# Patient Record
Sex: Female | Born: 1975
Health system: Southern US, Community
[De-identification: ages and names within clinical notes are randomized; demographics above are authoritative.]

## PROBLEM LIST (undated history)

## (undated) DIAGNOSIS — H04129 Dry eye syndrome of unspecified lacrimal gland: Secondary | ICD-10-CM

## (undated) DIAGNOSIS — E063 Autoimmune thyroiditis: Secondary | ICD-10-CM

## (undated) DIAGNOSIS — S134XXA Sprain of ligaments of cervical spine, initial encounter: Secondary | ICD-10-CM

## (undated) DIAGNOSIS — N94819 Vulvodynia, unspecified: Secondary | ICD-10-CM

## (undated) DIAGNOSIS — E039 Hypothyroidism, unspecified: Secondary | ICD-10-CM

## (undated) DIAGNOSIS — G35 Multiple sclerosis: Secondary | ICD-10-CM

## (undated) HISTORY — PX: OTHER SURGICAL HISTORY: SHX169

## (undated) HISTORY — DX: Vulvodynia, unspecified: N94.819

## (undated) HISTORY — DX: Hypothyroidism, unspecified: E03.9

## (undated) HISTORY — DX: Multiple sclerosis: G35

## (undated) HISTORY — DX: Dry eye syndrome of unspecified lacrimal gland: H04.129

## (undated) HISTORY — DX: Autoimmune thyroiditis: E06.3

---

## 2009-12-06 HISTORY — PX: KNEE SURGERY: SHX244

## 2015-12-07 HISTORY — PX: OTHER SURGICAL HISTORY: SHX169

## 2018-11-16 ENCOUNTER — Other Ambulatory Visit (HOSPITAL_COMMUNITY)
Admission: RE | Admit: 2018-11-16 | Discharge: 2018-11-16 | Disposition: A | Discharge status: Home / Self Care | Payer: BLUE CROSS/BLUE SHIELD | Source: Ambulatory Visit | Attending: Obstetrics and Gynecology | Admitting: Obstetrics and Gynecology

## 2018-11-16 ENCOUNTER — Other Ambulatory Visit: Payer: Self-pay | Admitting: Obstetrics and Gynecology

## 2018-11-16 DIAGNOSIS — Z01419 Encounter for gynecological examination (general) (routine) without abnormal findings: Secondary | ICD-10-CM | POA: Diagnosis not present

## 2018-11-22 LAB — CYTOLOGY - PAP
Diagnosis: NEGATIVE
HPV: NOT DETECTED

## 2019-04-17 DIAGNOSIS — E559 Vitamin D deficiency, unspecified: Secondary | ICD-10-CM | POA: Diagnosis not present

## 2019-04-17 DIAGNOSIS — R5383 Other fatigue: Secondary | ICD-10-CM | POA: Diagnosis not present

## 2019-04-17 DIAGNOSIS — F419 Anxiety disorder, unspecified: Secondary | ICD-10-CM | POA: Diagnosis not present

## 2019-04-17 DIAGNOSIS — G35 Multiple sclerosis: Secondary | ICD-10-CM | POA: Diagnosis not present

## 2019-05-08 ENCOUNTER — Other Ambulatory Visit: Payer: Self-pay | Admitting: Obstetrics and Gynecology

## 2019-05-08 DIAGNOSIS — Z1231 Encounter for screening mammogram for malignant neoplasm of breast: Secondary | ICD-10-CM

## 2019-06-25 ENCOUNTER — Other Ambulatory Visit: Payer: Self-pay

## 2019-06-25 ENCOUNTER — Ambulatory Visit
Admission: RE | Admit: 2019-06-25 | Discharge: 2019-06-25 | Disposition: A | Discharge status: Home / Self Care | Payer: BC Managed Care – PPO | Source: Ambulatory Visit | Attending: Obstetrics and Gynecology | Admitting: Obstetrics and Gynecology

## 2019-06-25 DIAGNOSIS — Z1231 Encounter for screening mammogram for malignant neoplasm of breast: Secondary | ICD-10-CM | POA: Diagnosis not present

## 2019-07-17 DIAGNOSIS — D509 Iron deficiency anemia, unspecified: Secondary | ICD-10-CM | POA: Diagnosis not present

## 2019-07-17 DIAGNOSIS — G35 Multiple sclerosis: Secondary | ICD-10-CM | POA: Diagnosis not present

## 2019-07-17 DIAGNOSIS — M545 Low back pain: Secondary | ICD-10-CM | POA: Diagnosis not present

## 2019-07-17 DIAGNOSIS — R5383 Other fatigue: Secondary | ICD-10-CM | POA: Diagnosis not present

## 2019-08-10 DIAGNOSIS — N762 Acute vulvitis: Secondary | ICD-10-CM | POA: Diagnosis not present

## 2019-08-10 DIAGNOSIS — N9089 Other specified noninflammatory disorders of vulva and perineum: Secondary | ICD-10-CM | POA: Diagnosis not present

## 2019-08-10 DIAGNOSIS — N898 Other specified noninflammatory disorders of vagina: Secondary | ICD-10-CM | POA: Diagnosis not present

## 2019-09-07 DIAGNOSIS — G35 Multiple sclerosis: Secondary | ICD-10-CM | POA: Diagnosis not present

## 2019-10-16 DIAGNOSIS — F419 Anxiety disorder, unspecified: Secondary | ICD-10-CM | POA: Diagnosis not present

## 2019-10-16 DIAGNOSIS — R5383 Other fatigue: Secondary | ICD-10-CM | POA: Diagnosis not present

## 2019-10-16 DIAGNOSIS — G35 Multiple sclerosis: Secondary | ICD-10-CM | POA: Diagnosis not present

## 2019-10-16 DIAGNOSIS — D509 Iron deficiency anemia, unspecified: Secondary | ICD-10-CM | POA: Diagnosis not present

## 2019-10-29 DIAGNOSIS — J029 Acute pharyngitis, unspecified: Secondary | ICD-10-CM | POA: Diagnosis not present

## 2019-11-20 DIAGNOSIS — H04123 Dry eye syndrome of bilateral lacrimal glands: Secondary | ICD-10-CM | POA: Diagnosis not present

## 2019-11-20 DIAGNOSIS — G35 Multiple sclerosis: Secondary | ICD-10-CM | POA: Diagnosis not present

## 2019-11-20 DIAGNOSIS — Z973 Presence of spectacles and contact lenses: Secondary | ICD-10-CM | POA: Diagnosis not present

## 2019-11-20 DIAGNOSIS — D3132 Benign neoplasm of left choroid: Secondary | ICD-10-CM | POA: Diagnosis not present

## 2019-11-23 DIAGNOSIS — Z01419 Encounter for gynecological examination (general) (routine) without abnormal findings: Secondary | ICD-10-CM | POA: Diagnosis not present

## 2019-12-10 DIAGNOSIS — R9082 White matter disease, unspecified: Secondary | ICD-10-CM | POA: Diagnosis not present

## 2019-12-10 DIAGNOSIS — G35 Multiple sclerosis: Secondary | ICD-10-CM | POA: Diagnosis not present

## 2019-12-12 DIAGNOSIS — G35 Multiple sclerosis: Secondary | ICD-10-CM | POA: Diagnosis not present

## 2020-02-07 DIAGNOSIS — E038 Other specified hypothyroidism: Secondary | ICD-10-CM | POA: Diagnosis not present

## 2020-02-07 DIAGNOSIS — E559 Vitamin D deficiency, unspecified: Secondary | ICD-10-CM | POA: Diagnosis not present

## 2020-02-07 DIAGNOSIS — E042 Nontoxic multinodular goiter: Secondary | ICD-10-CM | POA: Diagnosis not present

## 2020-02-07 DIAGNOSIS — G35 Multiple sclerosis: Secondary | ICD-10-CM | POA: Diagnosis not present

## 2020-02-07 DIAGNOSIS — Z1331 Encounter for screening for depression: Secondary | ICD-10-CM | POA: Diagnosis not present

## 2020-02-11 ENCOUNTER — Other Ambulatory Visit: Payer: Self-pay | Admitting: Internal Medicine

## 2020-02-11 DIAGNOSIS — E042 Nontoxic multinodular goiter: Secondary | ICD-10-CM

## 2020-02-15 ENCOUNTER — Other Ambulatory Visit: Payer: Self-pay

## 2020-02-15 ENCOUNTER — Ambulatory Visit
Admission: RE | Admit: 2020-02-15 | Discharge: 2020-02-15 | Disposition: A | Discharge status: Home / Self Care | Payer: BC Managed Care – PPO | Source: Ambulatory Visit | Attending: Internal Medicine | Admitting: Internal Medicine

## 2020-02-15 DIAGNOSIS — E042 Nontoxic multinodular goiter: Secondary | ICD-10-CM | POA: Diagnosis not present

## 2020-03-18 DIAGNOSIS — G35 Multiple sclerosis: Secondary | ICD-10-CM | POA: Diagnosis not present

## 2020-04-30 DIAGNOSIS — Z79899 Other long term (current) drug therapy: Secondary | ICD-10-CM | POA: Diagnosis not present

## 2020-04-30 DIAGNOSIS — G35 Multiple sclerosis: Secondary | ICD-10-CM | POA: Diagnosis not present

## 2020-04-30 DIAGNOSIS — G8911 Acute pain due to trauma: Secondary | ICD-10-CM | POA: Diagnosis not present

## 2020-04-30 DIAGNOSIS — M542 Cervicalgia: Secondary | ICD-10-CM | POA: Diagnosis not present

## 2020-04-30 DIAGNOSIS — M436 Torticollis: Secondary | ICD-10-CM | POA: Diagnosis not present

## 2020-06-04 DIAGNOSIS — E559 Vitamin D deficiency, unspecified: Secondary | ICD-10-CM | POA: Diagnosis not present

## 2020-06-04 DIAGNOSIS — E038 Other specified hypothyroidism: Secondary | ICD-10-CM | POA: Diagnosis not present

## 2020-06-04 DIAGNOSIS — Z0000 Encounter for general adult medical examination without abnormal findings: Secondary | ICD-10-CM | POA: Diagnosis not present

## 2020-06-11 DIAGNOSIS — E042 Nontoxic multinodular goiter: Secondary | ICD-10-CM | POA: Diagnosis not present

## 2020-06-11 DIAGNOSIS — Z0000 Encounter for general adult medical examination without abnormal findings: Secondary | ICD-10-CM | POA: Diagnosis not present

## 2020-06-11 DIAGNOSIS — Z1331 Encounter for screening for depression: Secondary | ICD-10-CM | POA: Diagnosis not present

## 2020-06-11 DIAGNOSIS — E559 Vitamin D deficiency, unspecified: Secondary | ICD-10-CM | POA: Diagnosis not present

## 2020-06-11 DIAGNOSIS — E039 Hypothyroidism, unspecified: Secondary | ICD-10-CM | POA: Diagnosis not present

## 2020-06-11 DIAGNOSIS — G35 Multiple sclerosis: Secondary | ICD-10-CM | POA: Diagnosis not present

## 2020-06-11 DIAGNOSIS — Z1212 Encounter for screening for malignant neoplasm of rectum: Secondary | ICD-10-CM | POA: Diagnosis not present

## 2020-09-29 DIAGNOSIS — G35 Multiple sclerosis: Secondary | ICD-10-CM | POA: Diagnosis not present

## 2020-10-22 DIAGNOSIS — M5431 Sciatica, right side: Secondary | ICD-10-CM | POA: Diagnosis not present

## 2020-10-22 DIAGNOSIS — M9903 Segmental and somatic dysfunction of lumbar region: Secondary | ICD-10-CM | POA: Diagnosis not present

## 2020-10-27 DIAGNOSIS — D3132 Benign neoplasm of left choroid: Secondary | ICD-10-CM | POA: Diagnosis not present

## 2020-10-27 DIAGNOSIS — H04123 Dry eye syndrome of bilateral lacrimal glands: Secondary | ICD-10-CM | POA: Diagnosis not present

## 2020-10-27 DIAGNOSIS — G35 Multiple sclerosis: Secondary | ICD-10-CM | POA: Diagnosis not present

## 2020-10-27 DIAGNOSIS — Z973 Presence of spectacles and contact lenses: Secondary | ICD-10-CM | POA: Diagnosis not present

## 2020-12-10 DIAGNOSIS — H04123 Dry eye syndrome of bilateral lacrimal glands: Secondary | ICD-10-CM | POA: Diagnosis not present

## 2020-12-29 DIAGNOSIS — D509 Iron deficiency anemia, unspecified: Secondary | ICD-10-CM | POA: Diagnosis not present

## 2020-12-29 DIAGNOSIS — F419 Anxiety disorder, unspecified: Secondary | ICD-10-CM | POA: Diagnosis not present

## 2020-12-29 DIAGNOSIS — R5383 Other fatigue: Secondary | ICD-10-CM | POA: Diagnosis not present

## 2020-12-29 DIAGNOSIS — N943 Premenstrual tension syndrome: Secondary | ICD-10-CM | POA: Diagnosis not present

## 2020-12-29 DIAGNOSIS — E559 Vitamin D deficiency, unspecified: Secondary | ICD-10-CM | POA: Diagnosis not present

## 2020-12-29 DIAGNOSIS — G35 Multiple sclerosis: Secondary | ICD-10-CM | POA: Diagnosis not present

## 2020-12-29 DIAGNOSIS — Z1329 Encounter for screening for other suspected endocrine disorder: Secondary | ICD-10-CM | POA: Diagnosis not present

## 2021-01-02 DIAGNOSIS — J342 Deviated nasal septum: Secondary | ICD-10-CM | POA: Diagnosis not present

## 2021-01-02 DIAGNOSIS — J3489 Other specified disorders of nose and nasal sinuses: Secondary | ICD-10-CM | POA: Diagnosis not present

## 2021-01-02 DIAGNOSIS — J309 Allergic rhinitis, unspecified: Secondary | ICD-10-CM | POA: Diagnosis not present

## 2021-01-22 DIAGNOSIS — J3489 Other specified disorders of nose and nasal sinuses: Secondary | ICD-10-CM | POA: Diagnosis not present

## 2021-01-22 DIAGNOSIS — M950 Acquired deformity of nose: Secondary | ICD-10-CM | POA: Diagnosis not present

## 2021-01-22 DIAGNOSIS — J342 Deviated nasal septum: Secondary | ICD-10-CM | POA: Diagnosis not present

## 2021-03-13 DIAGNOSIS — Z01419 Encounter for gynecological examination (general) (routine) without abnormal findings: Secondary | ICD-10-CM | POA: Diagnosis not present

## 2021-04-16 DIAGNOSIS — G35 Multiple sclerosis: Secondary | ICD-10-CM | POA: Diagnosis not present

## 2021-04-22 DIAGNOSIS — G35 Multiple sclerosis: Secondary | ICD-10-CM | POA: Diagnosis not present

## 2021-04-22 DIAGNOSIS — R42 Dizziness and giddiness: Secondary | ICD-10-CM | POA: Diagnosis not present

## 2021-04-29 DIAGNOSIS — H04123 Dry eye syndrome of bilateral lacrimal glands: Secondary | ICD-10-CM | POA: Diagnosis not present

## 2021-04-29 DIAGNOSIS — G35 Multiple sclerosis: Secondary | ICD-10-CM | POA: Diagnosis not present

## 2021-04-29 DIAGNOSIS — Z973 Presence of spectacles and contact lenses: Secondary | ICD-10-CM | POA: Diagnosis not present

## 2021-05-04 IMAGING — MG DIGITAL SCREENING BILATERAL MAMMOGRAM WITH TOMO AND CAD
8 series · 9 of 24 positions shown · non-contrast
Comparison: None.

CLINICAL DATA: Screening.

EXAM:
DIGITAL SCREENING BILATERAL MAMMOGRAM WITH TOMO AND CAD

[L CC synth-2D]
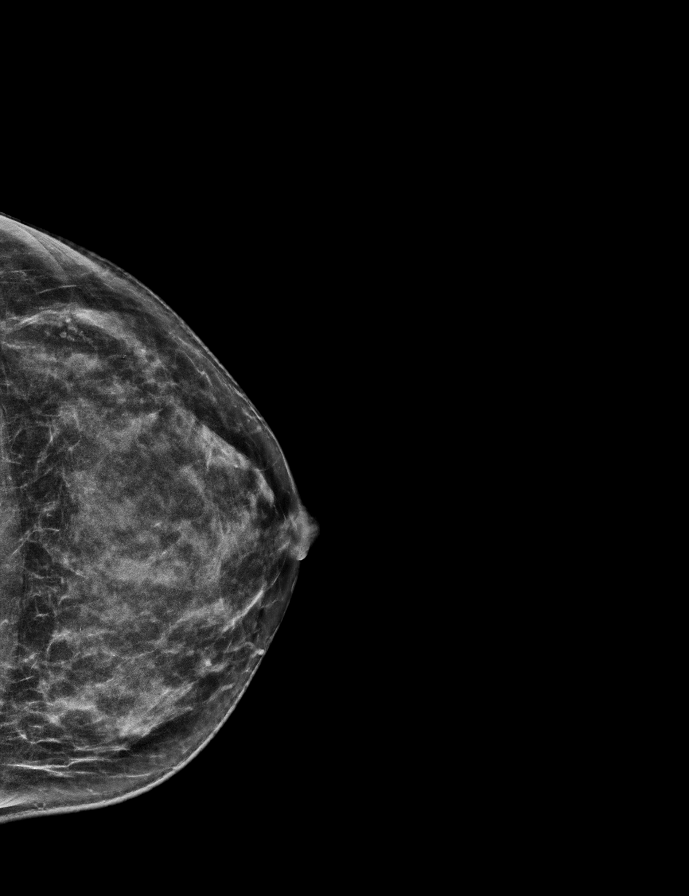

[R MLO synth-2D]
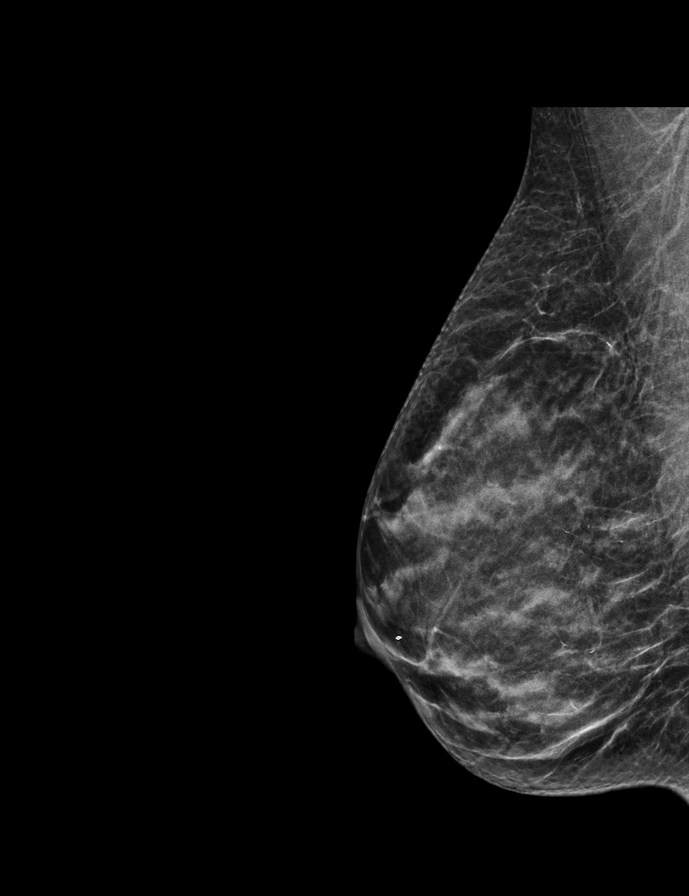

[L MLO synth-2D]
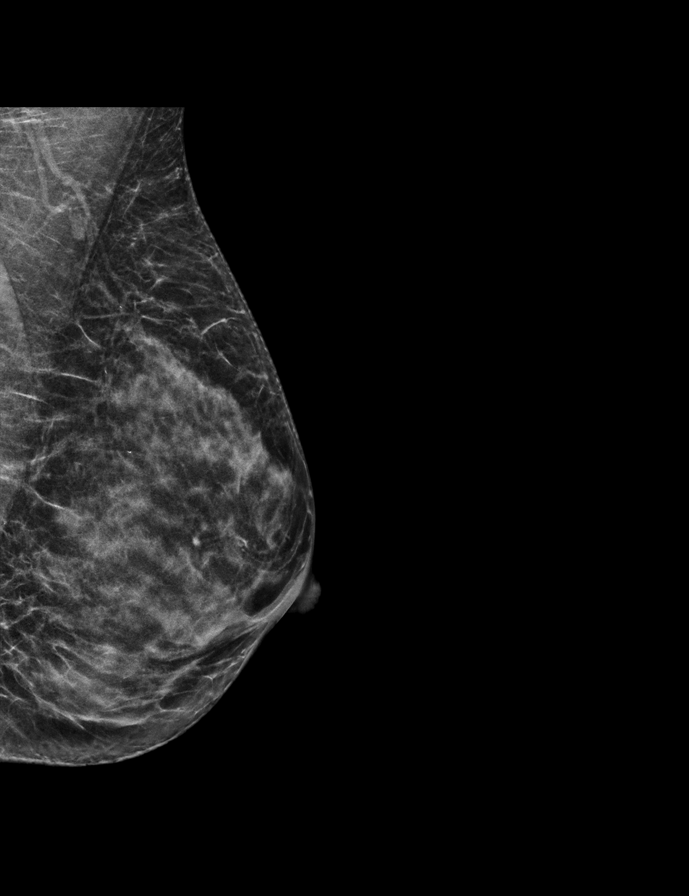

[R CC synth-2D]
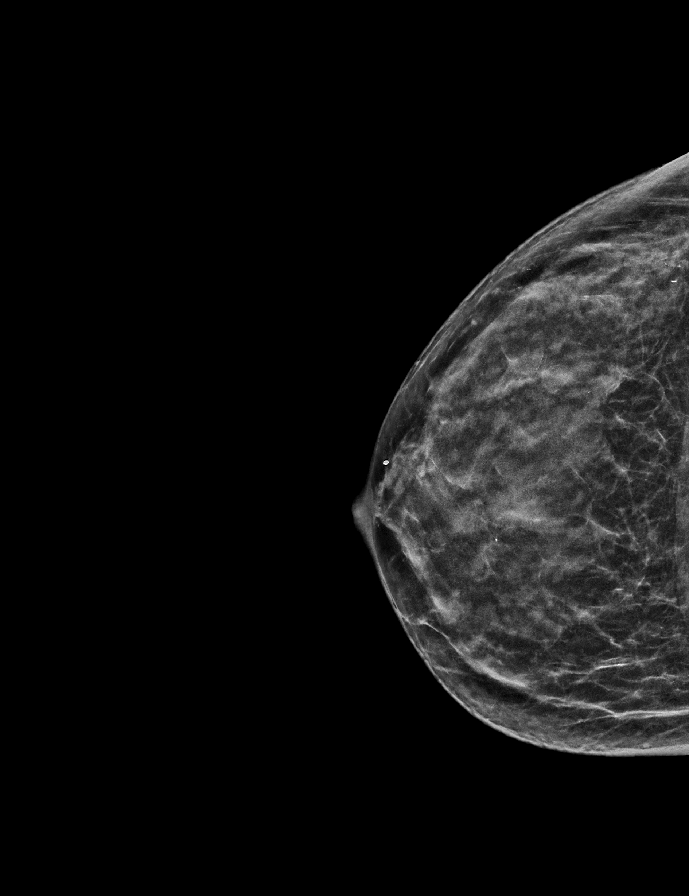

[L MLO tomo · 2 of 48 frames shown]
[frame 16/48]
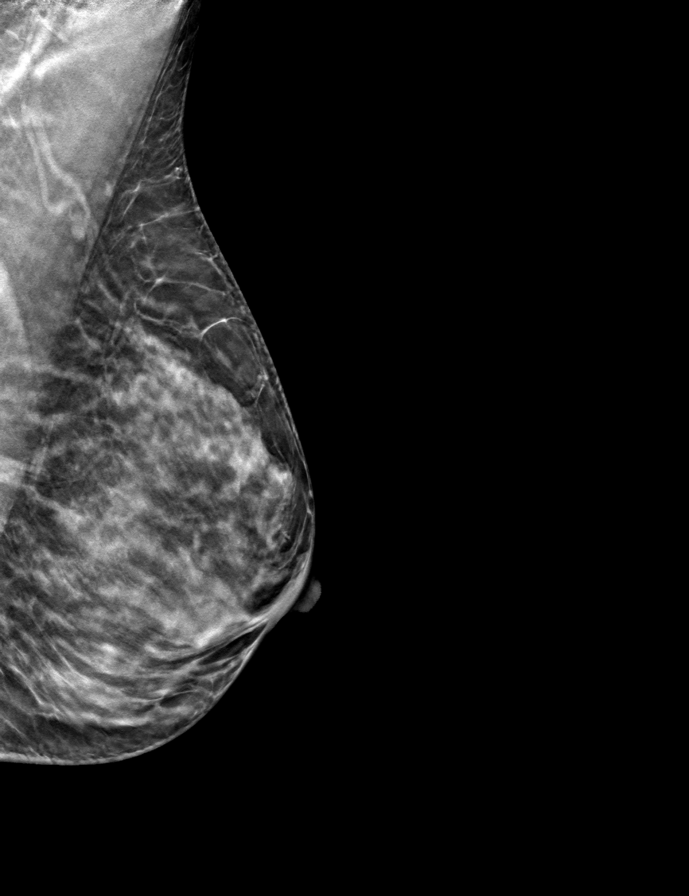
[frame 25/48]
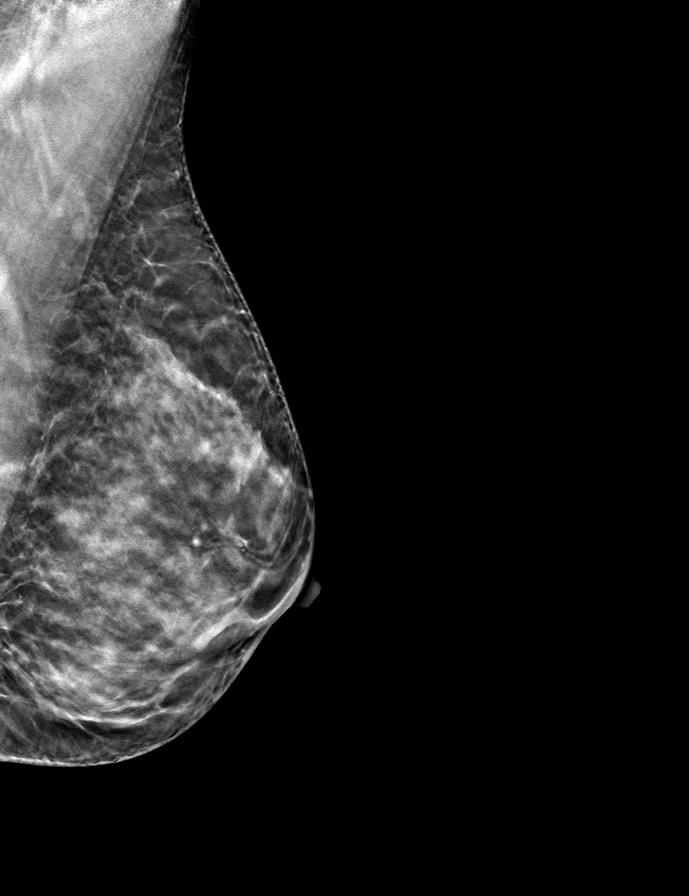

[L CC tomo · tomo slice 25/50.0]
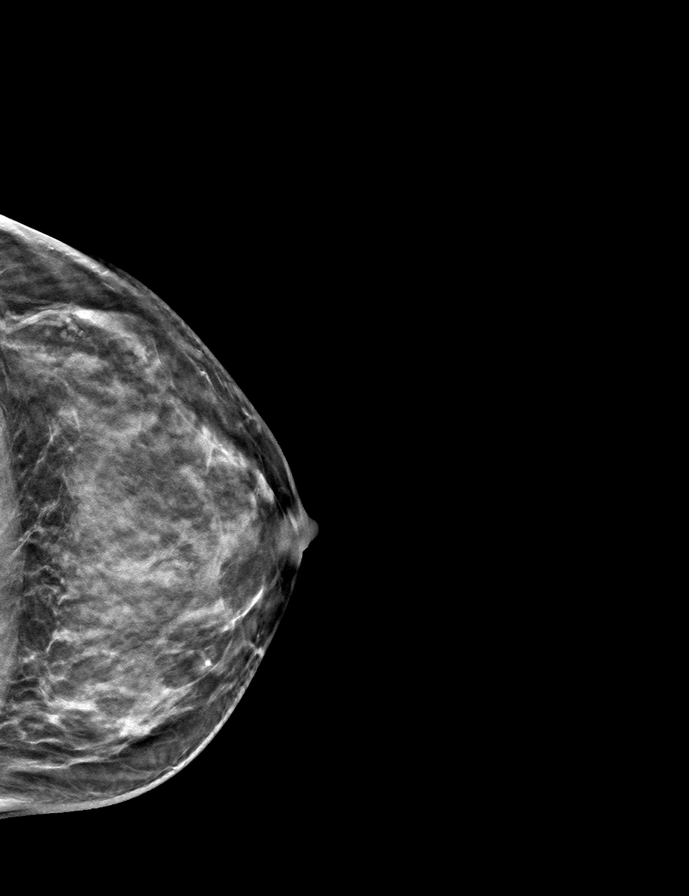

[R MLO tomo · tomo slice 23/46.0]
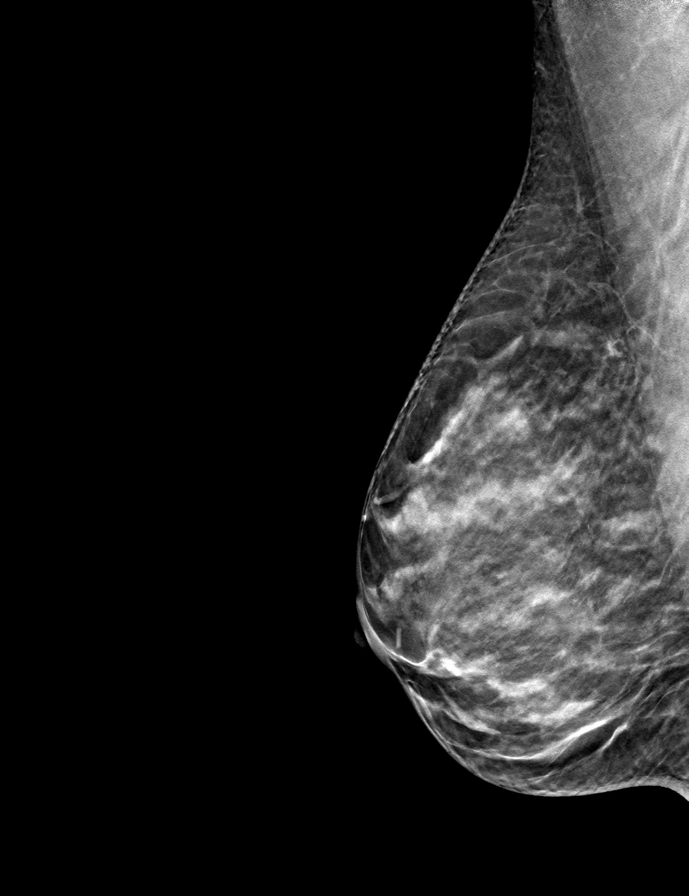

[R CC tomo · tomo slice 23/46.0]
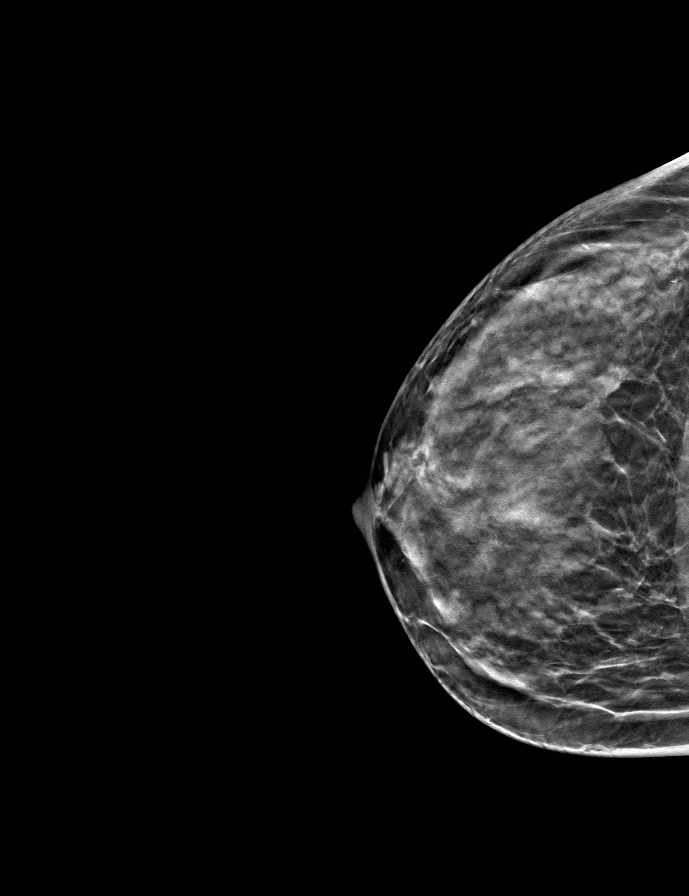

[9 of 24 positions shown; findings below may reference images not displayed]

ACR Breast Density Category c: The breast tissue is heterogeneously
dense, which may obscure small masses
FINDINGS: There are no findings suspicious for malignancy. Images were
processed with CAD.
IMPRESSION: No mammographic evidence of malignancy. A result letter of this
screening mammogram will be mailed directly to the patient.

RECOMMENDATION:
Screening mammogram in one year. (Code:EM-2-IHY)

BI-RADS CATEGORY  1: Negative.

## 2021-05-06 DIAGNOSIS — R42 Dizziness and giddiness: Secondary | ICD-10-CM | POA: Diagnosis not present

## 2021-05-06 DIAGNOSIS — G35 Multiple sclerosis: Secondary | ICD-10-CM | POA: Diagnosis not present

## 2021-05-11 DIAGNOSIS — R42 Dizziness and giddiness: Secondary | ICD-10-CM | POA: Diagnosis not present

## 2021-05-11 DIAGNOSIS — G35 Multiple sclerosis: Secondary | ICD-10-CM | POA: Diagnosis not present

## 2021-05-14 DIAGNOSIS — G35 Multiple sclerosis: Secondary | ICD-10-CM | POA: Diagnosis not present

## 2021-05-14 DIAGNOSIS — R42 Dizziness and giddiness: Secondary | ICD-10-CM | POA: Diagnosis not present

## 2021-05-20 DIAGNOSIS — R102 Pelvic and perineal pain: Secondary | ICD-10-CM | POA: Diagnosis not present

## 2021-05-21 DIAGNOSIS — N838 Other noninflammatory disorders of ovary, fallopian tube and broad ligament: Secondary | ICD-10-CM | POA: Diagnosis not present

## 2021-05-25 ENCOUNTER — Encounter: Payer: Self-pay | Admitting: Gynecologic Oncology

## 2021-05-25 ENCOUNTER — Other Ambulatory Visit: Payer: Self-pay

## 2021-05-26 ENCOUNTER — Other Ambulatory Visit: Payer: Self-pay

## 2021-05-26 ENCOUNTER — Inpatient Hospital Stay: Payer: BC Managed Care – PPO | Attending: Gynecologic Oncology | Admitting: Gynecologic Oncology

## 2021-05-26 ENCOUNTER — Other Ambulatory Visit: Payer: Self-pay | Admitting: Gynecologic Oncology

## 2021-05-26 ENCOUNTER — Encounter: Payer: Self-pay | Admitting: Gynecologic Oncology

## 2021-05-26 VITALS — BP 123/82 | HR 99 | Temp 97.2°F | Resp 18 | Ht 65.0 in | Wt 140.4 lb

## 2021-05-26 DIAGNOSIS — G35 Multiple sclerosis: Secondary | ICD-10-CM | POA: Diagnosis not present

## 2021-05-26 DIAGNOSIS — N946 Dysmenorrhea, unspecified: Secondary | ICD-10-CM | POA: Insufficient documentation

## 2021-05-26 DIAGNOSIS — R971 Elevated cancer antigen 125 [CA 125]: Secondary | ICD-10-CM

## 2021-05-26 DIAGNOSIS — E039 Hypothyroidism, unspecified: Secondary | ICD-10-CM | POA: Diagnosis not present

## 2021-05-26 DIAGNOSIS — D390 Neoplasm of uncertain behavior of uterus: Secondary | ICD-10-CM | POA: Diagnosis not present

## 2021-05-26 DIAGNOSIS — D3912 Neoplasm of uncertain behavior of left ovary: Secondary | ICD-10-CM | POA: Diagnosis not present

## 2021-05-26 DIAGNOSIS — N838 Other noninflammatory disorders of ovary, fallopian tube and broad ligament: Secondary | ICD-10-CM

## 2021-05-26 DIAGNOSIS — N920 Excessive and frequent menstruation with regular cycle: Secondary | ICD-10-CM | POA: Diagnosis not present

## 2021-05-26 DIAGNOSIS — N858 Other specified noninflammatory disorders of uterus: Secondary | ICD-10-CM | POA: Diagnosis not present

## 2021-05-26 DIAGNOSIS — N9489 Other specified conditions associated with female genital organs and menstrual cycle: Secondary | ICD-10-CM

## 2021-05-26 MED ORDER — SENNOSIDES-DOCUSATE SODIUM 8.6-50 MG PO TABS: 2.0000 | ORAL_TABLET | Freq: Every day | ORAL | 0 refills | Status: AC

## 2021-05-26 MED ORDER — TRAMADOL HCL 50 MG PO TABS: 50.0000 mg | ORAL_TABLET | Freq: Four times a day (QID) | ORAL | 0 refills | Status: AC | PRN

## 2021-05-26 MED ORDER — IBUPROFEN 800 MG PO TABS: 800.0000 mg | ORAL_TABLET | Freq: Three times a day (TID) | ORAL | 0 refills | Status: AC | PRN

## 2021-05-26 NOTE — H&P (View-Only) (Signed)
Consult Note: Gyn-Onc  Consult was requested by Dr. Delora Fuel for the evaluation of Wendy Grimes 45 y.o. female  CC:  Chief Complaint  Patient presents with   Ovarian mass, left    Assessment/Plan:  Wendy Grimes  is a 45 y.o.  year old with a left ovarian mass, endometrial mass and elevated CA 125.  I have performed endometrial biopsy today. She requires a CT scan to evaluate for upper abdominal disease associated with advanced endometrial cancer.  If both of these are negative, it is reasonable to assume that this process may be secondary to a benign process such as endometriosis, or possible intermittent torsion.  I am recommending definitive evaluation with robotic assisted unilateral salpingo-oophorectomy possible hysterectomy possible contralateral oophorectomy, possible staging with lymphadenectomy and biopsies.  I offered the patient hysterectomy regardless of pathology findings in the left ovary due to her history of menorrhagia and dysmenorrhea.  However the patient is electing to retain her uterus unless today's biopsy shows pathology or frozen section of the left ovary indicates malignancy.  I counseled the patient and her husband regarding surgical risks including  bleeding, infection, damage to internal organs (such as bladder,ureters, bowels), blood clot, reoperation and rehospitalization.  HPI: Wendy Grimes is a 45 year old P2 who was seen in consultation at the request of Dr Delora Fuel for evaluation of a left ovarian mass and elevated CA 125.  Patient had an episode of abrupt acute severe pelvic pain on May 14, 2021.  This occurred in the setting of a longstanding history of dysmenorrhea and menorrhagia.  She contacted her gynecologist for an evaluation.  The pain spontaneously resolved approximately 5 days after its inception.  The patient was seen on May 20, 2021 when a transvaginal ultrasound scan was performed which revealed a uterus measuring 7.64 x 4.7  x 5.5 cm.  There was a hyperechoic mass within the endometrium measuring 1 cm in greatest dimension.  The right ovary was within normal limits and the left ovary measured 4.7 cm in greatest dimension and contained a 3.4 cm hypoechoic solid-appearing mass with no blood flow noted.  There is a small amount of fluid seen in the cul-de-sac.  A Ca1 25 was drawn on 05/22/2021 which included a Roma score.  The CA125 was elevated at 359, the HD4 was normal at 57.9 however the premenopausal Roma was elevated at 1.22.  Of note the patient's gynecologic exam had been normal in April 2022.  Her medical history is most significant for multiple sclerosis.  Her surgical history is most significant for knee surgery and removal of a lipoma.  Her gynecologic history is remarkable for 2 prior vaginal deliveries.  She has menorrhagia and dysmenorrhea.  Her last Pap in 2019 was normal and negative for high-risk HPV.  Her family cancer history is unremarkable.  She does not work outside of the home. She lives with her husband and 16 year old daughter.  She has not been vaccinated against COVID-19.   Current Meds:  Outpatient Encounter Medications as of 05/26/2021  Medication Sig   Cholecalciferol 125 MCG (5000 UT) TABS Take 5,000 Units by mouth daily.   ferrous sulfate 325 (65 FE) MG tablet Take 325 mg by mouth daily.   Glatiramer Acetate 40 MG/ML SOSY Inject 40 mg into the skin 3 (three) times a week.   levothyroxine (SYNTHROID) 88 MCG tablet Take 88 mcg by mouth daily.   ipratropium (ATROVENT) 0.03 % nasal spray Place 2 sprays into the nose 2 (  two) times daily. (Patient not taking: Reported on 05/25/2021)   No facility-administered encounter medications on file as of 05/26/2021.    Allergy: No Known Allergies  Social Hx:   Social History   Socioeconomic History   Marital status: Unknown    Spouse name: Not on file   Number of children: Not on file   Years of education: Not on file   Highest education  level: Not on file  Occupational History   Not on file  Tobacco Use   Smoking status: Never   Smokeless tobacco: Never  Vaping Use   Vaping Use: Never used  Substance and Sexual Activity   Alcohol use: Never   Drug use: Never   Sexual activity: Yes    Birth control/protection: Other-see comments    Comment: Vasectomy  Other Topics Concern   Not on file  Social History Narrative   Not on file   Social Determinants of Health   Financial Resource Strain: Not on file  Food Insecurity: Not on file  Transportation Needs: Not on file  Physical Activity: Not on file  Stress: Not on file  Social Connections: Not on file  Intimate Partner Violence: Not on file    Past Surgical Hx:  Past Surgical History:  Procedure Laterality Date   KNEE SURGERY Left 2011    Past Medical Hx:  Past Medical History:  Diagnosis Date   Dry eye    Hashimoto's disease    Hypothyroidism    Wendy (multiple sclerosis) ([place])    Vulvodynia     Past Gynecological History:  see HPI, SVD x 2 No LMP recorded.  Family Hx:  Family History  Problem Relation Age of Onset   Breast cancer Neg Hx    Colon cancer Neg Hx    Ovarian cancer Neg Hx    Endometrial cancer Neg Hx    Pancreatic cancer Neg Hx    Prostate cancer Neg Hx     Review of Systems:  Constitutional  Feels well,    ENT Normal appearing ears and nares bilaterally Skin/Breast  No rash, sores, jaundice, itching, dryness Cardiovascular  No chest pain, shortness of breath, or edema  Pulmonary  No cough or wheeze.  Gastro Intestinal  No nausea, vomitting, or diarrhoea. No bright red blood per rectum, no abdominal pain, change in bowel movement, or constipation.  Genito Urinary  No frequency, urgency, dysuria, + menorrhagia, + dysmenorrhea Musculo Skeletal  No myalgia, arthralgia, joint swelling or pain  Neurologic  No weakness, numbness, change in gait,  Psychology  No depression, anxiety, insomnia.   Vitals:  Blood pressure  123/82, pulse 99, temperature (!) 97.2 F (36.2 C), temperature source Tympanic, resp. rate 18, height 5\' 5"  (1.651 m), weight 140 lb 6.4 oz (63.7 kg), SpO2 99 %.  Physical Exam: WD in NAD Neck  Supple NROM, without any enlargements.  Lymph Node Survey No cervical supraclavicular or inguinal adenopathy Cardiovascular  Pulse normal rate, regularity and rhythm. S1 and S2 normal.  Lungs  Clear to auscultation bilateraly, without wheezes/crackles/rhonchi. Good air movement.  Skin  No rash/lesions/breakdown  Psychiatry  Alert and oriented to person, place, and time  Abdomen  Normoactive bowel sounds, abdomen soft, non-tender and nonobese without evidence of hernia.  Back No CVA tenderness Genito Urinary  Vulva/vagina: Normal external female genitalia.  No lesions. No discharge or bleeding.  Bladder/urethra:  No lesions or masses, well supported bladder  Vagina: normal  Cervix: Normal appearing, no lesions.  Uterus:  Small, mobile,  no parametrial involvement or nodularity.  Adnexa: no palpable masses. Rectal  Good tone, no masses no cul de sac nodularity.  Extremities  No bilateral cyanosis, clubbing or edema.  Procedure Note:  Preop Dx: Abnormal uterine bleeding and endometrial mass Postop Dx: Same Procedure: Endometrial biopsy Surgeon: Dorann Ou, MD EBL: Scant Specimens: Endometrium Complications: None Procedure Details: The patient provided verbal consent and a verbal timeout was performed.  Speculum was inserted to the vagina and the cervix was visualized.  The endometrial Pipelle was inserted to a depth of 7 cm and aspirated.  2 passes into the endometrial cavity took place for a small amount of tissue.  The patient tolerated the procedure well.  The specimen was sent for histopathology.   75 minutes of total time was spent for this patient encounter, including preparation, face-to-face counseling with the patient and coordination of care, review of imaging (results and  images), communication with the referring provider and documentation of the encounter.   [name], MD  05/26/2021, 11:31 AM

## 2021-05-26 NOTE — Progress Notes (Signed)
Consult Note: Gyn-Onc  Consult was requested by Dr. Delora Fuel for the evaluation of Wendy Grimes 45 y.o. female  CC:  Chief Complaint  Patient presents with   Ovarian mass, left    Assessment/Plan:  [name]  is a 45 y.o.  year old with a left ovarian mass, endometrial mass and elevated CA 125.  I have performed endometrial biopsy today. She requires a CT scan to evaluate for upper abdominal disease associated with advanced endometrial cancer.  If both of these are negative, it is reasonable to assume that this process may be secondary to a benign process such as endometriosis, or possible intermittent torsion.  I am recommending definitive evaluation with robotic assisted unilateral salpingo-oophorectomy possible hysterectomy possible contralateral oophorectomy, possible staging with lymphadenectomy and biopsies.  I offered the patient hysterectomy regardless of pathology findings in the left ovary due to her history of menorrhagia and dysmenorrhea.  However the patient is electing to retain her uterus unless today's biopsy shows pathology or frozen section of the left ovary indicates malignancy.  I counseled the patient and her husband regarding surgical risks including  bleeding, infection, damage to internal organs (such as bladder,ureters, bowels), blood clot, reoperation and rehospitalization.  HPI: [name] is a 45 year old P2 who was seen in consultation at the request of Dr Delora Fuel for evaluation of a left ovarian mass and elevated CA 125. 45 Patient had an episode of abrupt acute severe pelvic pain on May 14, 2021.  This occurred in the setting of a longstanding history of dysmenorrhea and menorrhagia.  She contacted her gynecologist for an evaluation.  The pain spontaneously resolved approximately 5 days after its inception.  The patient was seen on May 20, 2021 when a transvaginal ultrasound scan was performed which revealed a uterus measuring 7.64 x 4.7  x 5.5 cm.  There was a hyperechoic mass within the endometrium measuring 1 cm in greatest dimension.  The right ovary was within normal limits and the left ovary measured 4.7 cm in greatest dimension and contained a 3.4 cm hypoechoic solid-appearing mass with no blood flow noted.  There is a small amount of fluid seen in the cul-de-sac.  A Ca1 25 was drawn on 05/22/2021 which included a Roma score.  The CA125 was elevated at 359, the HD4 was normal at 57.9 however the premenopausal Roma was elevated at 1.22.  Of note the patient's gynecologic exam had been normal in April 2022.  Her medical history is most significant for multiple sclerosis.  Her surgical history is most significant for knee surgery and removal of a lipoma.  Her gynecologic history is remarkable for 2 prior vaginal deliveries.  She has menorrhagia and dysmenorrhea.  Her last Pap in 2019 was normal and negative for high-risk HPV.  Her family cancer history is unremarkable.  She does not work outside of the home. She lives with her husband and 19 year old daughter.  She has not been vaccinated against COVID-19.   Current Meds:  Outpatient Encounter Medications as of 05/26/2021  Medication Sig   Cholecalciferol 125 MCG (5000 UT) TABS Take 5,000 Units by mouth daily.   ferrous sulfate 325 (65 FE) MG tablet Take 325 mg by mouth daily.   Glatiramer Acetate 40 MG/ML SOSY Inject 40 mg into the skin 3 (three) times a week.   levothyroxine (SYNTHROID) 88 MCG tablet Take 88 mcg by mouth daily.   ipratropium (ATROVENT) 0.03 % nasal spray Place 2 sprays into the nose 2 (  two) times daily. (Patient not taking: Reported on 05/25/2021)   No facility-administered encounter medications on file as of 05/26/2021.    Allergy: No Known Allergies  Social Hx:   Social History   Socioeconomic History   Marital status: Unknown    Spouse name: Not on file   Number of children: Not on file   Years of education: Not on file   Highest education  level: Not on file  Occupational History   Not on file  Tobacco Use   Smoking status: Never   Smokeless tobacco: Never  Vaping Use   Vaping Use: Never used  Substance and Sexual Activity   Alcohol use: Never   Drug use: Never   Sexual activity: Yes    Birth control/protection: Other-see comments    Comment: Vasectomy  Other Topics Concern   Not on file  Social History Narrative   Not on file   Social Determinants of Health   Financial Resource Strain: Not on file  Food Insecurity: Not on file  Transportation Needs: Not on file  Physical Activity: Not on file  Stress: Not on file  Social Connections: Not on file  Intimate Partner Violence: Not on file    Past Surgical Hx:  Past Surgical History:  Procedure Laterality Date   KNEE SURGERY Left 2011    Past Medical Hx:  Past Medical History:  Diagnosis Date   Dry eye    Hashimoto's disease    Hypothyroidism    MS (multiple sclerosis) ([place])    Vulvodynia     Past Gynecological History:  see HPI, SVD x 2 No LMP recorded.  Family Hx:  Family History  Problem Relation Age of Onset   Breast cancer Neg Hx    Colon cancer Neg Hx    Ovarian cancer Neg Hx    Endometrial cancer Neg Hx    Pancreatic cancer Neg Hx    Prostate cancer Neg Hx     Review of Systems:  Constitutional  Feels well,    ENT Normal appearing ears and nares bilaterally Skin/Breast  No rash, sores, jaundice, itching, dryness Cardiovascular  No chest pain, shortness of breath, or edema  Pulmonary  No cough or wheeze.  Gastro Intestinal  No nausea, vomitting, or diarrhoea. No bright red blood per rectum, no abdominal pain, change in bowel movement, or constipation.  Genito Urinary  No frequency, urgency, dysuria, + menorrhagia, + dysmenorrhea Musculo Skeletal  No myalgia, arthralgia, joint swelling or pain  Neurologic  No weakness, numbness, change in gait,  Psychology  No depression, anxiety, insomnia.   Vitals:  Blood pressure  123/82, pulse 99, temperature (!) 97.2 F (36.2 C), temperature source Tympanic, resp. rate 18, height 5\' 5"  (1.651 m), weight 140 lb 6.4 oz (63.7 kg), SpO2 99 %.  Physical Exam: WD in NAD Neck  Supple NROM, without any enlargements.  Lymph Node Survey No cervical supraclavicular or inguinal adenopathy Cardiovascular  Pulse normal rate, regularity and rhythm. S1 and S2 normal.  Lungs  Clear to auscultation bilateraly, without wheezes/crackles/rhonchi. Good air movement.  Skin  No rash/lesions/breakdown  Psychiatry  Alert and oriented to person, place, and time  Abdomen  Normoactive bowel sounds, abdomen soft, non-tender and nonobese without evidence of hernia.  Back No CVA tenderness Genito Urinary  Vulva/vagina: Normal external female genitalia.  No lesions. No discharge or bleeding.  Bladder/urethra:  No lesions or masses, well supported bladder  Vagina: normal  Cervix: Normal appearing, no lesions.  Uterus:  Small, mobile,  no parametrial involvement or nodularity.  Adnexa: no palpable masses. Rectal  Good tone, no masses no cul de sac nodularity.  Extremities  No bilateral cyanosis, clubbing or edema.  Procedure Note:  Preop Dx: Abnormal uterine bleeding and endometrial mass Postop Dx: Same Procedure: Endometrial biopsy Surgeon: Dorann Ou, MD EBL: Scant Specimens: Endometrium Complications: None Procedure Details: The patient provided verbal consent and a verbal timeout was performed.  Speculum was inserted to the vagina and the cervix was visualized.  The endometrial Pipelle was inserted to a depth of 7 cm and aspirated.  2 passes into the endometrial cavity took place for a small amount of tissue.  The patient tolerated the procedure well.  The specimen was sent for histopathology.   75 minutes of total time was spent for this patient encounter, including preparation, face-to-face counseling with the patient and coordination of care, review of imaging (results and  images), communication with the referring provider and documentation of the encounter.   [name], MD  05/26/2021, 11:31 AM

## 2021-05-26 NOTE — Patient Instructions (Signed)
You had an endometrial biopsy today. We will contact you with the results. We have also ordered a CT scan of the abdomen and pelvis and will contact you with the results.   Preparing for your Surgery  Plan for surgery on June 10, 2021 with Dr. Everitt Amber at [facility]. You will be scheduled for a robotic assisted laparoscopic unilateral salpingo-oophorectomy (removal of one ovary and fallopian tube), possible total laparoscopic hysterectomy (removal of the uterus and cervix), possible staging if a cancer is identified.   Pre-operative Testing -You will receive a phone call from presurgical testing at [facility] to arrange for a pre-operative appointment and lab work.  -Bring your insurance card, copy of an advanced directive if applicable, medication list  -At that visit, you will be asked to sign a consent for a possible blood transfusion in case a transfusion becomes necessary during surgery.  The need for a blood transfusion is rare but having consent is a necessary part of your care.     -You should not be taking blood thinners or aspirin at least ten days prior to surgery unless instructed by your surgeon.  -Do not take supplements such as fish oil (omega 3), red yeast rice, turmeric before your surgery. You want to avoid medications with aspirin in them including headache powders such as BC or Goody's), Excedrin migraine.  Day Before Surgery at [place] will be asked to take in a light diet the day before surgery. You will be advised you can have clear liquids up until 3 hours before your surgery.    Eat a light diet the day before surgery.  Examples including soups, broths, toast, yogurt, mashed potatoes.  AVOID GAS PRODUCING FOODS. Things to avoid include carbonated beverages (fizzy beverages, sodas), raw fruits and raw vegetables (uncooked), or beans.   If your bowels are filled with gas, your surgeon will have difficulty visualizing your  pelvic organs which increases your surgical risks.  Your role in recovery Your role is to become active as soon as directed by your doctor, while still giving yourself time to heal.  Rest when you feel tired. You will be asked to do the following in order to speed your recovery:  - Cough and breathe deeply. This helps to clear and expand your lungs and can prevent pneumonia after surgery.  - [place]. Do mild physical activity. Walking or moving your legs help your circulation and body functions return to normal. Do not try to get up or walk alone the first time after surgery.   -If you develop swelling on one leg or the other, pain in the back of your leg, redness/warmth in one of your legs, please call the office or go to the Emergency Room to have a doppler to rule out a blood clot. For shortness of breath, chest pain-seek care in the Emergency Room as soon as possible. - Actively manage your pain. Managing your pain lets you move in comfort. We will ask you to rate your pain on a scale of zero to 10. It is your responsibility to tell your doctor or nurse where and how much you hurt so your pain can be treated.  Special Considerations -If you are diabetic, you may be placed on insulin after surgery to have closer control over your blood sugars to promote healing and recovery.  This does not mean that you will be discharged on insulin.  If applicable, your oral  antidiabetics will be resumed when you are tolerating a solid diet.  -Your final pathology results from surgery should be available around one week after surgery and the results will be relayed to you when available.  -FMLA forms can be faxed to [phone] and please allow 5-7 business days for completion.  Pain Management After Surgery -You have been prescribed your pain medication and bowel regimen medications before surgery so that you can have these available when you are discharged from the hospital. The pain  medication is for use ONLY AFTER surgery and a new prescription will not be given.   -Make sure that you have Tylenol and Ibuprofen at home to use on a regular basis after surgery for pain control. We recommend alternating the medications every hour to six hours since they work differently and are processed in the body differently for pain relief.  -Review the attached handout on narcotic use and their risks and side effects.   Bowel Regimen -You have been prescribed Sennakot-S to take nightly to prevent constipation especially if you are taking the narcotic pain medication intermittently.  It is important to prevent constipation and drink adequate amounts of liquids. You can stop taking this medication when you are not taking pain medication and you are back on your normal bowel routine.  Risks of Surgery Risks of surgery are low but include bleeding, infection, damage to surrounding structures, re-operation, blood clots, and very rarely death.   Blood Transfusion Information (For the consent to be signed before surgery)  We will be checking your blood type before surgery so in case of emergencies, we will know what type of blood you would need.                                            WHAT IS A BLOOD TRANSFUSION?  A transfusion is the replacement of blood or some of its parts. Blood is made up of multiple cells which provide different functions. Red blood cells carry oxygen and are used for blood loss replacement. White blood cells fight against infection. Platelets control bleeding. Plasma helps clot blood. Other blood products are available for specialized needs, such as hemophilia or other clotting disorders. BEFORE THE TRANSFUSION  Who gives blood for transfusions?  You may be able to donate blood to be used at a later date on yourself (autologous donation). Relatives can be asked to donate blood. This is generally not any safer than if you have received blood from a stranger. The  same precautions are taken to ensure safety when a relative's blood is donated. Healthy volunteers who are fully evaluated to make sure their blood is safe. This is blood bank blood. Transfusion therapy is the safest it has ever been in the practice of medicine. Before blood is taken from a donor, a complete history is taken to make sure that person has no history of diseases nor engages in risky social behavior (examples are intravenous drug use or sexual activity with multiple partners). The donor's travel history is screened to minimize risk of transmitting infections, such as malaria. The donated blood is tested for signs of infectious diseases, such as HIV and hepatitis. The blood is then tested to be sure it is compatible with you in order to minimize the chance of a transfusion reaction. If you or a relative donates blood, this is often done in anticipation of  surgery and is not appropriate for emergency situations. It takes many days to process the donated blood. RISKS AND COMPLICATIONS Although transfusion therapy is very safe and saves many lives, the main dangers of transfusion include:  Getting an infectious disease. Developing a transfusion reaction. This is an allergic reaction to something in the blood you were given. Every precaution is taken to prevent this. The decision to have a blood transfusion has been considered carefully by your caregiver before blood is given. Blood is not given unless the benefits outweigh the risks.  AFTER SURGERY INSTRUCTIONS  Return to work: 4 weeks if applicable  Activity: 1. Be up and out of the bed during the day.  Take a nap if needed.  You may walk up steps but be careful and use the hand rail.  Stair climbing will tire you more than you think, you may need to stop part way and rest.   2. No lifting or straining for 6 weeks over 10 pounds. No pushing, pulling, straining for 6 weeks.  3. No driving for around 1 week(s).  Do not drive if you are  taking narcotic pain medicine and make sure that your reaction time has returned.   4. You can shower as soon as the next day after surgery. Shower daily.  Use your regular soap and water (not directly on the incision) and pat your incision(s) dry afterwards; don't rub.  No tub baths or submerging your body in water until cleared by your surgeon. If you have the soap that was given to you by pre-surgical testing that was used before surgery, you do not need to use it afterwards because this can irritate your incisions.   5. No sexual activity and nothing in the vagina for 4 weeks. IF YOU HAVE A HYSTERECTOMY, NOTHING IN THE VAGINA FOR 8 WEEKS.  6. You may experience a small amount of clear drainage from your incisions, which is normal.  If the drainage persists, increases, or changes color please call the office.  7. Do not use creams, lotions, or ointments such as neosporin on your incisions after surgery until advised by your surgeon because they can cause removal of the dermabond glue on your incisions.    8. You may experience vaginal spotting after surgery or (IF YOU HAVE A HYSTERECTOMY) around the 6-8 week mark from surgery when the stitches at the top of the vagina begin to dissolve.  The spotting is normal but if you experience heavy bleeding, call our office.  9. Take Tylenol or ibuprofen first for pain and only use narcotic pain medication for severe pain not relieved by the Tylenol or Ibuprofen.  Monitor your Tylenol intake to a max of 4,000 mg in a 24 hour period. You can alternate these medications after surgery.  Diet: 1. Low sodium Heart Healthy Diet is recommended but you are cleared to resume your normal (before surgery) diet after your procedure.  2. It is safe to use a laxative, such as Miralax or Colace, if you have difficulty moving your bowels. You have been prescribed Sennakot at bedtime every evening to keep bowel movements regular and to prevent constipation.    Wound  Care: 1. Keep clean and dry.  Shower daily.  Reasons to call the Doctor: Fever - Oral temperature greater than 100.4 degrees Fahrenheit Foul-smelling vaginal discharge Difficulty urinating Nausea and vomiting Increased pain at the site of the incision that is unrelieved with pain medicine. Difficulty breathing with or without chest pain New calf  pain especially if only on one side Sudden, continuing increased vaginal bleeding with or without clots.   Contacts: For questions or concerns you should contact:  Dr. Everitt Amber at [phone]  [name], NP at [phone]  After Hours: call [phone] and have the GYN Oncologist paged/contacted (after 5 pm or on the weekends).  Messages sent via mychart are for non-urgent matters and are not responded to after hours so for urgent needs, please call the after hours number.  ENDOMETRIAL BIOPSY POST-PROCEDURE INSTRUCTIONS   You may take Ibuprofen, Aleve or Tylenol for pain if needed.  Cramping should resolve within in 24 hours.  You may have a small amount of spotting.  You should wear a mini pad for the next few days.  You may have intercourse after 24 hours.  You need to call if you have any pelvic pain, fever, heavy bleeding or foul smelling vaginal discharge.  Shower or bathe as normal  6. We will call you within one week with results or we will discuss   the results at your follow-up appointment if needed.

## 2021-05-28 ENCOUNTER — Telehealth: Payer: Self-pay

## 2021-05-28 DIAGNOSIS — G35 Multiple sclerosis: Secondary | ICD-10-CM | POA: Diagnosis not present

## 2021-05-28 DIAGNOSIS — N838 Other noninflammatory disorders of ovary, fallopian tube and broad ligament: Secondary | ICD-10-CM | POA: Diagnosis not present

## 2021-05-28 LAB — SURGICAL PATHOLOGY

## 2021-05-28 NOTE — Telephone Encounter (Signed)
Notified Wendy Grimes that her endometrial biopsy showed no cancer or precancer. Patient verbalized understanding and requested that results be sent to her PCP Erline Levine at [facility].  Results have been routed.

## 2021-05-29 ENCOUNTER — Telehealth: Payer: Self-pay

## 2021-05-29 NOTE — Telephone Encounter (Signed)
Patient called stating her PCP has recommended several new vitamins, some of which the patient is unfamiliar with. The patient is questioning wether she should take these leading up to surgery. Per [name], NP it is not recommended to start these prior to surgery as we do not know the effect it would have on blood clotting and anesthesia. Patient verbalized understanding.

## 2021-06-02 ENCOUNTER — Ambulatory Visit (HOSPITAL_COMMUNITY)
Admission: RE | Admit: 2021-06-02 | Discharge: 2021-06-02 | Disposition: A | Discharge status: Home / Self Care | Payer: BC Managed Care – PPO | Source: Ambulatory Visit | Attending: Gynecologic Oncology | Admitting: Gynecologic Oncology

## 2021-06-02 ENCOUNTER — Other Ambulatory Visit: Payer: Self-pay

## 2021-06-02 DIAGNOSIS — N838 Other noninflammatory disorders of ovary, fallopian tube and broad ligament: Secondary | ICD-10-CM | POA: Diagnosis not present

## 2021-06-02 DIAGNOSIS — R1900 Intra-abdominal and pelvic swelling, mass and lump, unspecified site: Secondary | ICD-10-CM | POA: Diagnosis not present

## 2021-06-02 DIAGNOSIS — R971 Elevated cancer antigen 125 [CA 125]: Secondary | ICD-10-CM

## 2021-06-02 DIAGNOSIS — N83202 Unspecified ovarian cyst, left side: Secondary | ICD-10-CM | POA: Diagnosis not present

## 2021-06-02 MED ORDER — IOHEXOL 300 MG/ML  SOLN
100.0000 mL | Freq: Once | INTRAMUSCULAR | Status: AC | PRN
  Administered 2021-06-02: 100 mL via INTRAVENOUS

## 2021-06-02 MED ORDER — SODIUM CHLORIDE (PF) 0.9 % IJ SOLN
INTRAMUSCULAR | Status: AC
  Filled 2021-06-02: qty 50

## 2021-06-03 ENCOUNTER — Other Ambulatory Visit: Payer: Self-pay

## 2021-06-03 ENCOUNTER — Encounter (HOSPITAL_BASED_OUTPATIENT_CLINIC_OR_DEPARTMENT_OTHER): Payer: Self-pay | Admitting: Gynecologic Oncology

## 2021-06-03 DIAGNOSIS — G8929 Other chronic pain: Secondary | ICD-10-CM

## 2021-06-03 DIAGNOSIS — M549 Dorsalgia, unspecified: Secondary | ICD-10-CM

## 2021-06-03 DIAGNOSIS — N838 Other noninflammatory disorders of ovary, fallopian tube and broad ligament: Secondary | ICD-10-CM

## 2021-06-03 DIAGNOSIS — Z973 Presence of spectacles and contact lenses: Secondary | ICD-10-CM

## 2021-06-03 DIAGNOSIS — J342 Deviated nasal septum: Secondary | ICD-10-CM

## 2021-06-03 DIAGNOSIS — R350 Frequency of micturition: Secondary | ICD-10-CM

## 2021-06-03 DIAGNOSIS — H04129 Dry eye syndrome of unspecified lacrimal gland: Secondary | ICD-10-CM

## 2021-06-03 HISTORY — DX: Other noninflammatory disorders of ovary, fallopian tube and broad ligament: N83.8

## 2021-06-03 HISTORY — DX: Frequency of micturition: R35.0

## 2021-06-03 HISTORY — DX: Presence of spectacles and contact lenses: Z97.3

## 2021-06-03 HISTORY — DX: Dry eye syndrome of unspecified lacrimal gland: H04.129

## 2021-06-03 HISTORY — DX: Other chronic pain: G89.29

## 2021-06-03 HISTORY — DX: Dorsalgia, unspecified: M54.9

## 2021-06-03 HISTORY — DX: Deviated nasal septum: J34.2

## 2021-06-03 NOTE — Progress Notes (Signed)
Your procedure is scheduled on  06-10-2021  Report to [place] pm   Call this number if you have problems the morning of surgery  :[phone].   OUR ADDRESS IS [place].  WE ARE LOCATED IN [facility].  PLEASE BRING YOUR INSURANCE CARD AND PHOTO ID DAY OF SURGERY.  ONLY ONE PERSON ALLOWED IN FACILITY WAITING AREA.                                     REMEMBER: LIGHT DIET DAY BEFORE SURGERY:  Eat a light diet the day before surgery.  Examples including soups, broths, toast, yogurt, mashed potatoes.  Things to avoid include carbonated beverages (fizzy beverages), raw fruits and raw vegetables, or beans.   If your bowels are filled with gas, your surgeon will have difficulty visualizing your pelvic organs which increases your surgical risks.    DO NOT EAT FOOD, CANDY GUM OR MINTS  AFTER MIDNIGHT . YOU MAY HAVE CLEAR LIQUIDS FROM MIDNIGHT UNTIL 1130 AM. NO CLEAR LIQUIDS AFTER  1130 AM DAY OF SURGERY.   YOU MAY  BRUSH YOUR TEETH MORNING OF SURGERY AND RINSE YOUR MOUTH OUT, NO CHEWING GUM CANDY OR MINTS.    CLEAR LIQUID DIET   Foods Allowed                                                                     Foods Excluded  Coffee and tea, regular and decaf                             liquids that you cannot  Plain Jell-O any favor except red or purple                                           see through such as: Fruit ices (not with fruit pulp)                                     milk, soups, orange juice  Iced Popsicles                                    All solid food Cranberry, grape and apple juices Sports drinks like Gatorade Lightly seasoned clear broth or consume(fat free) Sugar, honey syrup  Sample Menu Breakfast                                Lunch                                     Supper Cranberry juice  Beef broth                            Chicken broth Jell-O                                      Grape juice                           Apple juice Coffee or tea                        Jell-O                                      Popsicle                                                Coffee or tea                        Coffee or tea  _____________________________________________________________________     TAKE THESE MEDICATIONS MORNING OF SURGERY WITH A SIP OF WATER: LEVOTHYROXINE.  ONE VISITOR IS ALLOWED IN WAITING ROOM ONLY DAY OF SURGERY.  NO VISITOR MAY SPEND THE NIGHT.  VISITOR ARE ALLOWED TO STAY UNTIL 800 PM.                                    DO NOT WEAR JEWERLY, MAKE UP. DO NOT WEAR LOTIONS, POWDERS, PERFUMES OR NAIL POLISH. DO NOT SHAVE FOR 24 HOURS PRIOR TO DAY OF SURGERY. MEN MAY SHAVE FACE AND NECK. CONTACTS, GLASSES, OR DENTURES MAY NOT BE WORN TO SURGERY.                                    [place] IS NOT RESPONSIBLE  FOR ANY BELONGINGS.                                                                    Marland Kitchen            - Preparing for Surgery Before surgery, you can play an important role.  Because skin is not sterile, your skin needs to be as free of germs as possible.  You can reduce the number of germs on your skin by washing with CHG (chlorahexidine gluconate) soap before surgery.  CHG is an antiseptic cleaner which kills germs and bonds with the skin to continue killing germs even after washing. Please DO NOT use if you have an allergy to CHG or antibacterial soaps.  If your skin becomes reddened/irritated stop using the CHG and inform your nurse when you arrive at Short Stay. Do not shave (including legs and underarms) for at  least 48 hours prior to the first CHG shower.  You may shave your face/neck. Please follow these instructions carefully:  1.  Shower with CHG Soap the night before surgery and the  morning of Surgery.  2.  If you choose to wash your hair, wash your hair first as usual with your  normal  shampoo.  3.  After you shampoo, rinse  your hair and body thoroughly to remove the  shampoo.                            4.  Use CHG as you would any other liquid soap.  You can apply chg directly  to the skin and wash                      Gently with a scrungie or clean washcloth.  5.  Apply the CHG Soap to your body ONLY FROM THE NECK DOWN.   Do not use on face/ open                           Wound or open sores. Avoid contact with eyes, ears mouth and genitals (private parts).                       Wash face,  Genitals (private parts) with your normal soap.             6.  Wash thoroughly, paying special attention to the area where your surgery  will be performed.  7.  Thoroughly rinse your body with warm water from the neck down.  8.  DO NOT shower/wash with your normal soap after using and rinsing off  the CHG Soap.                9.  Pat yourself dry with a clean towel.            10.  Wear clean pajamas.            11.  Place clean sheets on your bed the night of your first shower and do not  sleep with pets. Day of Surgery : Do not apply any lotions/deodorants the morning of surgery.  Please wear clean clothes to the hospital/surgery center.  FAILURE TO FOLLOW THESE INSTRUCTIONS MAY RESULT IN THE CANCELLATION OF YOUR SURGERY PATIENT SIGNATURE_________________________________  NURSE SIGNATURE__________________________________  ________________________________________________________________________                                                        QUESTIONS Hansel Feinstein PRE OP NURSE PHONE [phone].

## 2021-06-03 NOTE — Progress Notes (Signed)
Spoke w/ via phone for pre-op interview---pt  Lab needs dos---- urine preg              Lab results------none COVID test -----patient states asymptomatic no test needed Arrive at -------1230 pm 06-10-2021 NPO after MN NO Solid Food.  Light diet  day before surgery,  Clear liquids from MN until---1130 am then npo Med rec completed Medications to take morning of surgery -----levothyroxine Diabetic medication -----n/a Patient instructed no nail polish to be worn day of surgery Patient instructed to bring photo id and insurance card day of surgery Patient aware to have Driver (ride ) / caregiver   spouse stu  for 24 hours after surgery  Patient Special Instructions -----none Pre-Op special Istructions -----none Patient verbalized understanding of instructions that were given at this phone interview. Patient denies shortness of breath, chest pain, fever, cough at this phone interview.

## 2021-06-04 ENCOUNTER — Telehealth: Payer: Self-pay

## 2021-06-04 NOTE — Telephone Encounter (Signed)
Told ms Frix that the CT scan shows the left ovarian mass. There is no evidence of enlarged lymph nodes or cancer spread. Pt verbalized understanding.

## 2021-06-05 ENCOUNTER — Telehealth: Payer: Self-pay

## 2021-06-05 NOTE — Telephone Encounter (Signed)
Received phone call from [place] this morning. She received a call from our office last night with her CT results and she is seeking clarification on the results. Restated the results that the CT shows the left ovarian mass and no evidence of enlarged lymph nodes or cancer spread.  Patient would like to know if this test confirmed cancer since it was with contrast. Explained that the contrast does not definitively confirm or rule out malignancy. It is a tool used to assist with imaging. She verbalized understanding.   Patient would like more information on simple cyst vs mixed solid and cystic mass. Instructed patient that someone will follow up with her, it may be on Tuesday with the holiday. Patient verbalizes understanding.  Also reviewed pre-op instructions with patient. She is compliant with pre-operative instructions.  Reinforced NPO after midnight.  No questions or concerns voiced.  Instructed to call for any needs.

## 2021-06-09 ENCOUNTER — Other Ambulatory Visit: Payer: Self-pay

## 2021-06-09 ENCOUNTER — Encounter (HOSPITAL_COMMUNITY)
Admission: RE | Admit: 2021-06-09 | Discharge: 2021-06-09 | Disposition: A | Discharge status: Home / Self Care | Payer: BC Managed Care – PPO | Source: Ambulatory Visit | Attending: Gynecologic Oncology | Admitting: Gynecologic Oncology

## 2021-06-09 DIAGNOSIS — Z28310 Unvaccinated for covid-19: Secondary | ICD-10-CM | POA: Diagnosis not present

## 2021-06-09 DIAGNOSIS — Z79899 Other long term (current) drug therapy: Secondary | ICD-10-CM | POA: Diagnosis not present

## 2021-06-09 DIAGNOSIS — D271 Benign neoplasm of left ovary: Secondary | ICD-10-CM | POA: Diagnosis not present

## 2021-06-09 DIAGNOSIS — Z79890 Hormone replacement therapy (postmenopausal): Secondary | ICD-10-CM | POA: Diagnosis not present

## 2021-06-09 DIAGNOSIS — N839 Noninflammatory disorder of ovary, fallopian tube and broad ligament, unspecified: Secondary | ICD-10-CM | POA: Diagnosis not present

## 2021-06-09 DIAGNOSIS — N838 Other noninflammatory disorders of ovary, fallopian tube and broad ligament: Secondary | ICD-10-CM | POA: Diagnosis not present

## 2021-06-09 LAB — COMPREHENSIVE METABOLIC PANEL
ALT: 12 U/L (ref 0–44)
AST: 18 U/L (ref 15–41)
Albumin: 4.3 g/dL (ref 3.5–5.0)
Alkaline Phosphatase: 33 U/L — ABNORMAL LOW (ref 38–126)
Anion gap: 7 (ref 5–15)
BUN: 10 mg/dL (ref 6–20)
CO2: 27 mmol/L (ref 22–32)
Calcium: 9.5 mg/dL (ref 8.9–10.3)
Chloride: 104 mmol/L (ref 98–111)
Creatinine, Ser: 0.88 mg/dL (ref 0.44–1.00)
GFR, Estimated: >60 mL/min (ref >60)
Glucose, Bld: 111 mg/dL — ABNORMAL HIGH (ref 70–99)
Potassium: 3.5 mmol/L (ref 3.5–5.1)
Sodium: 138 mmol/L (ref 135–145)
Total Bilirubin: 1.2 mg/dL (ref 0.3–1.2)
Total Protein: 7 g/dL (ref 6.5–8.1)

## 2021-06-09 LAB — CBC
HCT: 39.5 % (ref 36.0–46.0)
Hemoglobin: 12.7 g/dL (ref 12.0–15.0)
MCH: 29.5 pg (ref 26.0–34.0)
MCHC: 32.2 g/dL (ref 30.0–36.0)
MCV: 91.9 fL (ref 80.0–100.0)
Platelets: 168 10*3/uL (ref 150–400)
RBC: 4.3 MIL/uL (ref 3.87–5.11)
RDW: 13.1 % (ref 11.5–15.5)
WBC: 4.7 10*3/uL (ref 4.0–10.5)
nRBC: 0 % (ref 0.0–0.2)

## 2021-06-09 NOTE — Progress Notes (Signed)
Spoke with pt by phone and  made aware no more shaving if shaved areas get irritation use dial soap and not surgical soap, may take es tylenol in am if needed let pre op nurse know in am if es tylenol taken prn

## 2021-06-09 NOTE — Telephone Encounter (Signed)
Spoke with [name] this morning regarding her questions about a simple versus a complex cyst. Per [name], NP a simple cyst is a fluid filled cyst. The complex cyst is a mixture of fluid and solid components. Once the cyst is removed and the pathologist looks at it they will know what it is. Patient verbalized understanding. Patient states she followed a clear liquid diet yesterday to try it out and felt as though her blood sugar was low. Instructed patient to eat a light diet today. She only needs to be on clear liquids after midnight tonight until 11:30am tomorrow 7/6. Patient verbalized understanding, she will call with questions or concerns.

## 2021-06-10 ENCOUNTER — Ambulatory Visit (HOSPITAL_BASED_OUTPATIENT_CLINIC_OR_DEPARTMENT_OTHER): Payer: BC Managed Care – PPO | Admitting: Certified Registered Nurse Anesthetist

## 2021-06-10 ENCOUNTER — Encounter (HOSPITAL_BASED_OUTPATIENT_CLINIC_OR_DEPARTMENT_OTHER)
Admission: RE | Disposition: A | Discharge status: Home / Self Care | Payer: Self-pay | Source: Home / Self Care | Attending: Gynecologic Oncology

## 2021-06-10 ENCOUNTER — Ambulatory Visit (HOSPITAL_BASED_OUTPATIENT_CLINIC_OR_DEPARTMENT_OTHER)
Admission: RE | Admit: 2021-06-10 | Discharge: 2021-06-10 | Disposition: A | Discharge status: Home / Self Care | Payer: BC Managed Care – PPO | Attending: Gynecologic Oncology | Admitting: Gynecologic Oncology

## 2021-06-10 ENCOUNTER — Encounter (HOSPITAL_BASED_OUTPATIENT_CLINIC_OR_DEPARTMENT_OTHER): Payer: Self-pay | Admitting: Gynecologic Oncology

## 2021-06-10 DIAGNOSIS — J342 Deviated nasal septum: Secondary | ICD-10-CM | POA: Diagnosis not present

## 2021-06-10 DIAGNOSIS — N838 Other noninflammatory disorders of ovary, fallopian tube and broad ligament: Secondary | ICD-10-CM | POA: Diagnosis present

## 2021-06-10 DIAGNOSIS — R971 Elevated cancer antigen 125 [CA 125]: Secondary | ICD-10-CM | POA: Diagnosis not present

## 2021-06-10 DIAGNOSIS — N839 Noninflammatory disorder of ovary, fallopian tube and broad ligament, unspecified: Secondary | ICD-10-CM | POA: Diagnosis not present

## 2021-06-10 DIAGNOSIS — G35 Multiple sclerosis: Secondary | ICD-10-CM | POA: Diagnosis not present

## 2021-06-10 DIAGNOSIS — Z28310 Unvaccinated for covid-19: Secondary | ICD-10-CM | POA: Diagnosis not present

## 2021-06-10 DIAGNOSIS — Z79899 Other long term (current) drug therapy: Secondary | ICD-10-CM | POA: Diagnosis not present

## 2021-06-10 DIAGNOSIS — Z79890 Hormone replacement therapy (postmenopausal): Secondary | ICD-10-CM | POA: Diagnosis not present

## 2021-06-10 DIAGNOSIS — E039 Hypothyroidism, unspecified: Secondary | ICD-10-CM | POA: Diagnosis not present

## 2021-06-10 DIAGNOSIS — N8302 Follicular cyst of left ovary: Secondary | ICD-10-CM | POA: Diagnosis not present

## 2021-06-10 DIAGNOSIS — D271 Benign neoplasm of left ovary: Secondary | ICD-10-CM | POA: Diagnosis not present

## 2021-06-10 DIAGNOSIS — N8312 Corpus luteum cyst of left ovary: Secondary | ICD-10-CM | POA: Diagnosis not present

## 2021-06-10 HISTORY — DX: Sprain of ligaments of cervical spine, initial encounter: S13.4XXA

## 2021-06-10 HISTORY — PX: ROBOTIC ASSISTED BILATERAL SALPINGO OOPHERECTOMY: SHX6078

## 2021-06-10 LAB — TYPE AND SCREEN
ABO/RH(D): O POS
Antibody Screen: NEGATIVE

## 2021-06-10 LAB — ABO/RH: ABO/RH(D): O POS

## 2021-06-10 LAB — POCT PREGNANCY, URINE: Preg Test, Ur: NEGATIVE

## 2021-06-10 SURGERY — SALPINGO-OOPHORECTOMY, BILATERAL, ROBOT-ASSISTED
Anesthesia: General

## 2021-06-10 MED ORDER — ACETAMINOPHEN 500 MG PO TABS: 1000.0000 mg | ORAL_TABLET | ORAL | Status: DC

## 2021-06-10 MED ORDER — MIDAZOLAM HCL 5 MG/5ML IJ SOLN
INTRAMUSCULAR | Status: DC | PRN
  Administered 2021-06-10: 2 mg via INTRAVENOUS

## 2021-06-10 MED ORDER — SUGAMMADEX SODIUM 200 MG/2ML IV SOLN
INTRAVENOUS | Status: DC | PRN
  Administered 2021-06-10: 200 mg via INTRAVENOUS

## 2021-06-10 MED ORDER — ONDANSETRON HCL 4 MG/2ML IJ SOLN
INTRAMUSCULAR | Status: AC
  Filled 2021-06-10: qty 2

## 2021-06-10 MED ORDER — EPHEDRINE 5 MG/ML INJ
INTRAVENOUS | Status: AC
  Filled 2021-06-10: qty 10

## 2021-06-10 MED ORDER — GABAPENTIN 300 MG PO CAPS
300.0000 mg | ORAL_CAPSULE | ORAL | Status: AC
  Administered 2021-06-10: 300 mg via ORAL

## 2021-06-10 MED ORDER — ACETAMINOPHEN 500 MG PO TABS
ORAL_TABLET | ORAL | Status: AC
  Filled 2021-06-10: qty 2

## 2021-06-10 MED ORDER — LIDOCAINE 2% (20 MG/ML) 5 ML SYRINGE
INTRAMUSCULAR | Status: DC | PRN
  Administered 2021-06-10: 60 mg via INTRAVENOUS

## 2021-06-10 MED ORDER — LACTATED RINGERS IV SOLN: INTRAVENOUS | Status: DC

## 2021-06-10 MED ORDER — FENTANYL CITRATE (PF) 100 MCG/2ML IJ SOLN
25.0000 ug | INTRAMUSCULAR | Status: DC | PRN
  Administered 2021-06-10 (×2): 25 ug via INTRAVENOUS

## 2021-06-10 MED ORDER — FENTANYL CITRATE (PF) 250 MCG/5ML IJ SOLN
INTRAMUSCULAR | Status: AC
  Filled 2021-06-10: qty 5

## 2021-06-10 MED ORDER — SODIUM CHLORIDE 0.9 % IR SOLN
Status: DC | PRN
  Administered 2021-06-10: 1

## 2021-06-10 MED ORDER — ONDANSETRON HCL 4 MG/2ML IJ SOLN
INTRAMUSCULAR | Status: DC | PRN
  Administered 2021-06-10: 4 mg via INTRAVENOUS

## 2021-06-10 MED ORDER — LIDOCAINE HCL (PF) 2 % IJ SOLN
INTRAMUSCULAR | Status: DC | PRN
  Administered 2021-06-10: 1.5 mg/kg/h via INTRADERMAL

## 2021-06-10 MED ORDER — SCOPOLAMINE 1 MG/3DAYS TD PT72
1.0000 | MEDICATED_PATCH | TRANSDERMAL | Status: DC
  Administered 2021-06-10: 1.5 mg via TRANSDERMAL

## 2021-06-10 MED ORDER — PROPOFOL 10 MG/ML IV BOLUS
INTRAVENOUS | Status: DC | PRN
  Administered 2021-06-10: 180 mg via INTRAVENOUS

## 2021-06-10 MED ORDER — DEXAMETHASONE SODIUM PHOSPHATE 10 MG/ML IJ SOLN: 4.0000 mg | INTRAMUSCULAR | Status: DC

## 2021-06-10 MED ORDER — FENTANYL CITRATE (PF) 100 MCG/2ML IJ SOLN
INTRAMUSCULAR | Status: DC | PRN
  Administered 2021-06-10: 100 ug via INTRAVENOUS

## 2021-06-10 MED ORDER — GABAPENTIN 300 MG PO CAPS
ORAL_CAPSULE | ORAL | Status: AC
  Filled 2021-06-10: qty 1

## 2021-06-10 MED ORDER — PROPOFOL 10 MG/ML IV BOLUS
INTRAVENOUS | Status: AC
  Filled 2021-06-10: qty 20

## 2021-06-10 MED ORDER — DIPHENHYDRAMINE HCL 25 MG PO CAPS
25.0000 mg | ORAL_CAPSULE | Freq: Once | ORAL | Status: AC
  Administered 2021-06-10: 25 mg via ORAL

## 2021-06-10 MED ORDER — CELECOXIB 200 MG PO CAPS
ORAL_CAPSULE | ORAL | Status: AC
  Filled 2021-06-10: qty 2

## 2021-06-10 MED ORDER — SCOPOLAMINE 1 MG/3DAYS TD PT72
MEDICATED_PATCH | TRANSDERMAL | Status: AC
  Filled 2021-06-10: qty 1

## 2021-06-10 MED ORDER — BUPIVACAINE HCL 0.25 % IJ SOLN
INTRAMUSCULAR | Status: DC | PRN
  Administered 2021-06-10: 15 mL

## 2021-06-10 MED ORDER — ONDANSETRON HCL 4 MG PO TABS: 4.0000 mg | ORAL_TABLET | Freq: Three times a day (TID) | ORAL | 0 refills | Status: AC | PRN

## 2021-06-10 MED ORDER — PHENYLEPHRINE 40 MCG/ML (10ML) SYRINGE FOR IV PUSH (FOR BLOOD PRESSURE SUPPORT)
PREFILLED_SYRINGE | INTRAVENOUS | Status: DC | PRN
  Administered 2021-06-10 (×3): 80 ug via INTRAVENOUS
  Administered 2021-06-10: 40 ug via INTRAVENOUS

## 2021-06-10 MED ORDER — ROCURONIUM BROMIDE 10 MG/ML (PF) SYRINGE
PREFILLED_SYRINGE | INTRAVENOUS | Status: AC
  Filled 2021-06-10: qty 10

## 2021-06-10 MED ORDER — CEFAZOLIN SODIUM-DEXTROSE 2-4 GM/100ML-% IV SOLN
INTRAVENOUS | Status: AC
  Filled 2021-06-10: qty 100

## 2021-06-10 MED ORDER — CEFAZOLIN SODIUM-DEXTROSE 2-4 GM/100ML-% IV SOLN
2.0000 g | INTRAVENOUS | Status: AC
  Administered 2021-06-10: 2 g via INTRAVENOUS

## 2021-06-10 MED ORDER — FENTANYL CITRATE (PF) 100 MCG/2ML IJ SOLN
INTRAMUSCULAR | Status: AC
  Filled 2021-06-10: qty 2

## 2021-06-10 MED ORDER — CELECOXIB 200 MG PO CAPS
400.0000 mg | ORAL_CAPSULE | ORAL | Status: AC
  Administered 2021-06-10: 400 mg via ORAL

## 2021-06-10 MED ORDER — MIDAZOLAM HCL 2 MG/2ML IJ SOLN
INTRAMUSCULAR | Status: AC
  Filled 2021-06-10: qty 2

## 2021-06-10 MED ORDER — PHENYLEPHRINE 40 MCG/ML (10ML) SYRINGE FOR IV PUSH (FOR BLOOD PRESSURE SUPPORT)
PREFILLED_SYRINGE | INTRAVENOUS | Status: AC
  Filled 2021-06-10: qty 10

## 2021-06-10 MED ORDER — DIPHENHYDRAMINE HCL 25 MG PO CAPS
ORAL_CAPSULE | ORAL | Status: AC
  Filled 2021-06-10: qty 1

## 2021-06-10 MED ORDER — ROCURONIUM BROMIDE 10 MG/ML (PF) SYRINGE
PREFILLED_SYRINGE | INTRAVENOUS | Status: DC | PRN
  Administered 2021-06-10: 10 mg via INTRAVENOUS
  Administered 2021-06-10: 40 mg via INTRAVENOUS

## 2021-06-10 MED ORDER — OXYCODONE HCL 5 MG PO TABS: 5.0000 mg | ORAL_TABLET | ORAL | Status: DC | PRN

## 2021-06-10 MED ORDER — DEXAMETHASONE SODIUM PHOSPHATE 10 MG/ML IJ SOLN
INTRAMUSCULAR | Status: DC | PRN
  Administered 2021-06-10: 10 mg via INTRAVENOUS

## 2021-06-10 MED ORDER — DEXAMETHASONE SODIUM PHOSPHATE 10 MG/ML IJ SOLN
INTRAMUSCULAR | Status: AC
  Filled 2021-06-10: qty 1

## 2021-06-10 MED ORDER — TRAMADOL HCL 50 MG PO TABS: 50.0000 mg | ORAL_TABLET | Freq: Four times a day (QID) | ORAL | Status: DC | PRN

## 2021-06-10 MED ORDER — EPHEDRINE SULFATE-NACL 50-0.9 MG/10ML-% IV SOSY
PREFILLED_SYRINGE | INTRAVENOUS | Status: DC | PRN
  Administered 2021-06-10 (×3): 10 mg via INTRAVENOUS

## 2021-06-10 MED ORDER — ONDANSETRON HCL 4 MG PO TABS
4.0000 mg | ORAL_TABLET | Freq: Four times a day (QID) | ORAL | Status: DC | PRN
Start: 2021-06-10 — End: 2021-06-10

## 2021-06-10 MED ORDER — ONDANSETRON HCL 4 MG/2ML IJ SOLN
4.0000 mg | Freq: Four times a day (QID) | INTRAMUSCULAR | Status: DC | PRN
Start: 2021-06-10 — End: 2021-06-10
  Administered 2021-06-10: 4 mg via INTRAVENOUS

## 2021-06-10 MED ORDER — ACETAMINOPHEN 500 MG PO TABS
1000.0000 mg | ORAL_TABLET | Freq: Once | ORAL | Status: AC
  Administered 2021-06-10: 1000 mg via ORAL

## 2021-06-10 SURGICAL SUPPLY — 44 items
ADH SKN CLS APL DERMABOND .7 (GAUZE/BANDAGES/DRESSINGS) ×2
BAG SPEC RTRVL LRG 6X4 10 (ENDOMECHANICALS) ×2
COVER BACK TABLE 60X90IN (DRAPES) ×3 IMPLANT
COVER TIP SHEARS 8 DVNC (MISCELLANEOUS) ×2 IMPLANT
COVER TIP SHEARS 8MM DA VINCI (MISCELLANEOUS) ×1
DERMABOND ADVANCED (GAUZE/BANDAGES/DRESSINGS) ×1
DERMABOND ADVANCED .7 DNX12 (GAUZE/BANDAGES/DRESSINGS) ×2 IMPLANT
DRAPE ARM DVNC X/XI (DISPOSABLE) ×8 IMPLANT
DRAPE COLUMN DVNC XI (DISPOSABLE) ×2 IMPLANT
DRAPE DA VINCI XI ARM (DISPOSABLE) ×4
DRAPE DA VINCI XI COLUMN (DISPOSABLE) ×1
DRAPE SHEET LG 3/4 BI-LAMINATE (DRAPES) ×3 IMPLANT
DRAPE SURG IRRIG POUCH 19X23 (DRAPES) ×3 IMPLANT
ELECT REM PT RETURN 9FT ADLT (ELECTROSURGICAL) ×3
ELECTRODE REM PT RTRN 9FT ADLT (ELECTROSURGICAL) ×2 IMPLANT
GAUZE 4X4 16PLY ~~LOC~~+RFID DBL (SPONGE) ×3 IMPLANT
GLOVE SURG ENC MOIS LTX SZ6 (GLOVE) ×12 IMPLANT
GLOVE SURG ENC MOIS LTX SZ6.5 (GLOVE) ×6 IMPLANT
HOLDER FOLEY CATH W/STRAP (MISCELLANEOUS) ×3 IMPLANT
IRRIG SUCT STRYKERFLOW 2 WTIP (MISCELLANEOUS) ×3
IRRIGATION SUCT STRKRFLW 2 WTP (MISCELLANEOUS) ×2 IMPLANT
KIT TURNOVER CYSTO (KITS) ×3 IMPLANT
LEGGING LITHOTOMY PAIR STRL (DRAPES) ×3 IMPLANT
MANIPULATOR UTERINE 4.5 ZUMI (MISCELLANEOUS) ×3 IMPLANT
NEEDLE HYPO 22GX1.5 SAFETY (NEEDLE) ×3 IMPLANT
OBTURATOR OPTICAL STANDARD 8MM (TROCAR) ×1
OBTURATOR OPTICAL STND 8 DVNC (TROCAR) ×2
OBTURATOR OPTICALSTD 8 DVNC (TROCAR) ×2 IMPLANT
PACK ROBOT GYN CUSTOM WL (TRAY / TRAY PROCEDURE) ×3 IMPLANT
PACK ROBOTIC GOWN (GOWN DISPOSABLE) ×3 IMPLANT
PAD POSITIONING PINK XL (MISCELLANEOUS) ×3 IMPLANT
PORT ACCESS TROCAR AIRSEAL 12 (TROCAR) ×2 IMPLANT
PORT ACCESS TROCAR AIRSEAL 5M (TROCAR) ×1
POUCH SPECIMEN RETRIEVAL 10MM (ENDOMECHANICALS) ×3 IMPLANT
SEAL CANN UNIV 5-8 DVNC XI (MISCELLANEOUS) ×6 IMPLANT
SEAL XI 5MM-8MM UNIVERSAL (MISCELLANEOUS) ×3
SET TRI-LUMEN FLTR TB AIRSEAL (TUBING) ×3 IMPLANT
SUT VIC AB 4-0 PS2 18 (SUTURE) ×6 IMPLANT
TRAP SPECIMEN MUCUS 40CC (MISCELLANEOUS) ×3 IMPLANT
TRAY FOL W/BAG SLVR 16FR STRL (SET/KITS/TRAYS/PACK) ×2 IMPLANT
TRAY FOLEY W/BAG SLVR 16FR LF (SET/KITS/TRAYS/PACK) ×3
TUBE CONNECTING 12X1/4 (SUCTIONS) ×3 IMPLANT
UNDERPAD 30X36 HEAVY ABSORB (UNDERPADS AND DIAPERS) ×3 IMPLANT
WATER STERILE IRR 1000ML POUR (IV SOLUTION) ×3 IMPLANT

## 2021-06-10 NOTE — Interval H&P Note (Signed)
History and Physical Interval Note:  06/10/2021 11:23 AM  Wendy Grimes  has presented today for surgery, with the diagnosis of LEFT OVARIAN MASS,ELEVATED CA 125.  The various methods of treatment have been discussed with the patient and family. After consideration of risks, benefits and other options for treatment, the patient has consented to  Procedure(s): XI ROBOTIC ASSISTED UNILATERAL SALPINGO OOPHORECTOMY (N/A) POSSIBLE XI ROBOTIC ASSISTED TOTAL HYSTERECTOMY (N/A) POSSIBLE LAPAROTOMY WITH STAGING (N/A) as a surgical intervention.  The patient's history has been reviewed, patient examined, no change in status, stable for surgery.  I have reviewed the patient's chart and labs.  Questions were answered to the patient's satisfaction.     [name]

## 2021-06-10 NOTE — Anesthesia Postprocedure Evaluation (Signed)
Anesthesia Post Note  Patient: Wendy Grimes  Procedure(s) Performed: XI ROBOTIC ASSISTED LEFT SALPINGO OOPHORECTOMY (Left)     Patient location during evaluation: PACU Anesthesia Type: General Level of consciousness: awake Pain management: pain level controlled Vital Signs Assessment: post-procedure vital signs reviewed and stable Respiratory status: spontaneous breathing Cardiovascular status: stable Postop Assessment: no apparent nausea or vomiting Anesthetic complications: no   No notable events documented.  Last Vitals:  Vitals:   06/10/21 1345 06/10/21 1452  BP: 118/80 115/73  Pulse:  77  Resp:  14  Temp:  36.6 C  SpO2:  100%    Last Pain:  Vitals:   06/10/21 1430  TempSrc:   PainSc: 3                  [name]

## 2021-06-10 NOTE — Discharge Instructions (Addendum)
06/10/2021  Return to work: 2-4 weeks if applicable  Today Dr. Denman George removed the left ovary and fallopian tube. On frozen section where the pathologist looks at the cyst under the microscope, they did NOT feel the cyst was cancer. We will contact you when the final pathology returns as well.  Activity: 1. Be up and out of the bed during the day.  Take a nap if needed.  You may walk up steps but be careful and use the hand rail.  Stair climbing will tire you more than you think, you may need to stop part way and rest.   2. No lifting or straining for 4-6 weeks.  3. No driving for 1 week(s).  Do not drive if you are taking narcotic pain medicine.  4. Shower daily.  Use your regular soap and water and pat your incision dry; don't rub.  No tub baths until cleared by your surgeon.   5. No sexual activity and nothing in the vagina for 4 weeks.  6. You may experience a small amount of clear drainage from your incisions, which is normal.  If the drainage persists or increases, please call the office.  7. You may experience vaginal spotting after surgery. The spotting is normal but if you experience heavy bleeding, call our office.  8. Take Tylenol or ibuprofen first for pain and only use narcotic pain medication for severe pain not relieved by the Tylenol or Ibuprofen.  Monitor your Tylenol intake to a max of 4,000 mg.  Diet: 1. Low sodium Heart Healthy Diet is recommended.  2. It is safe to use a laxative, such as Miralax or Colace, if you have difficulty moving your bowels. You can take Sennakot at bedtime every evening to keep bowel movements regular and to prevent constipation.    Wound Care: 1. Keep clean and dry.  Shower daily.  Reasons to call the Doctor: Fever - Oral temperature greater than 100.4 degrees Fahrenheit Foul-smelling vaginal discharge Difficulty urinating Nausea and vomiting Increased pain at the site of the incision that is unrelieved with pain medicine. Difficulty  breathing with or without chest pain New calf pain especially if only on one side Sudden, continuing increased vaginal bleeding with or without clots.   Contacts: For questions or concerns you should contact:  Dr. Everitt Amber at [phone]  [name], NP at [phone]  After Hours: call [phone] and have the GYN Oncologist paged/contacted      Post Anesthesia Home Care Instructions  Activity: Get plenty of rest for the remainder of the day. A responsible individual must stay with you for 24 hours following the procedure.  For the next 24 hours, DO NOT: -Drive a car -Paediatric nurse -Drink alcoholic beverages -Take any medication unless instructed by your physician -Make any legal decisions or sign important papers.  Meals: Start with liquid foods such as gelatin or soup. Progress to regular foods as tolerated. Avoid greasy, spicy, heavy foods. If nausea and/or vomiting occur, drink only clear liquids until the nausea and/or vomiting subsides. Call your physician if vomiting continues.  Special Instructions/Symptoms: Your throat may feel dry or sore from the anesthesia or the breathing tube placed in your throat during surgery. If this causes discomfort, gargle with warm salt water. The discomfort should disappear within 24 hours.  If you had a scopolamine patch placed behind your ear for the management of post- operative nausea and/or vomiting:  1. The medication in the patch is effective for 72 hours, after which it  should be removed.  Wrap patch in a tissue and discard in the trash. Wash hands thoroughly with soap and water. 2. You may remove the patch earlier than 72 hours if you experience unpleasant side effects which may include dry mouth, dizziness or visual disturbances. 3. Avoid touching the patch. Wash your hands with soap and water after contact with the patch.  DISCHARGE INSTRUCTIONS: Laparoscopy  The following instructions have been prepared to help  you care for yourself upon your return home today.  Wound care:  Do not get the incision wet for the first 24 hours. The incision should be kept clean and dry.  The Band-Aids or dressings may be removed the day after surgery.  Should the incision become sore, red, and swollen after the first week, check with your doctor.  Personal hygiene:  Shower the day after your procedure.  Activity and limitations:  Do NOT drive or operate any equipment today.  Do NOT lift anything more than 15 pounds for 2-3 weeks after surgery.  Do NOT rest in bed all day.  Walking is encouraged. Walk each day, starting slowly with 5-minute walks 3 or 4 times a day. Slowly increase the length of your walks.  Walk up and down stairs slowly.  Do NOT do strenuous activities, such as golfing, playing tennis, bowling, running, biking, weight lifting, gardening, mowing, or vacuuming for 2-4 weeks. Ask your doctor when it is okay to start.  Diet: Eat a light meal as desired this evening. You may resume your usual diet tomorrow.  Return to work: This is dependent on the type of work you do. For the most part you can return to a desk job within a week of surgery. If you are more active at work, please discuss this with your doctor.  What to expect after your surgery: You may have a slight burning sensation when you urinate on the first day. You may have a very small amount of blood in the urine. Expect to have a small amount of vaginal discharge/light bleeding for 1-2 weeks. It is not unusual to have abdominal soreness and bruising for up to 2 weeks. You may be tired and need more rest for about 1 week. You may experience shoulder pain for 24-72 hours. Lying flat in bed may relieve it.  Call your doctor for any of the following:  Develop a fever of 100.4 or greater  Inability to urinate 6 hours after discharge from hospital  Severe pain not relieved by pain medications  Persistent of heavy bleeding at incision site   Redness or swelling around incision site after a week  Increasing nausea or vomiting

## 2021-06-10 NOTE — Anesthesia Preprocedure Evaluation (Addendum)
Anesthesia Evaluation  Patient identified by MRN, date of birth, ID band Patient awake    Reviewed: Allergy & Precautions, NPO status , Patient's Chart, lab work & pertinent test results  Airway Mallampati: II       Dental  (+)    Pulmonary neg pulmonary ROS,    breath sounds clear to auscultation       Cardiovascular negative cardio ROS   Rhythm:Regular Rate:Normal     Neuro/Psych    GI/Hepatic negative GI ROS, Neg liver ROS,   Endo/Other  Hypothyroidism   Renal/GU      Musculoskeletal   Abdominal   Peds  Hematology   Anesthesia Other Findings   Reproductive/Obstetrics                           Anesthesia Physical Anesthesia Plan  ASA: 2  Anesthesia Plan: General   Post-op Pain Management:    Induction: Intravenous  PONV Risk Score and Plan: 3 and Ondansetron, Dexamethasone and Midazolam  Airway Management Planned: Oral ETT  Additional Equipment:   Intra-op Plan:   Post-operative Plan: Extubation in OR  Informed Consent: I have reviewed the patients History and Physical, chart, labs and discussed the procedure including the risks, benefits and alternatives for the proposed anesthesia with the patient or authorized representative who has indicated his/her understanding and acceptance.     Dental advisory given  Plan Discussed with: CRNA and Anesthesiologist  Anesthesia Plan Comments:         Anesthesia Quick Evaluation

## 2021-06-10 NOTE — Transfer of Care (Signed)
Immediate Anesthesia Transfer of Care Note  Patient: Wendy Grimes  Procedure(s) Performed: XI ROBOTIC ASSISTED LEFT SALPINGO OOPHORECTOMY (Left)  Patient Location: PACU  Anesthesia Type:General  Level of Consciousness: awake, alert , oriented and patient cooperative  Airway & Oxygen Therapy: Patient Spontanous Breathing  Post-op Assessment: Report given to RN and Post -op Vital signs reviewed and stable  Post vital signs: Reviewed and stable  Last Vitals:  Vitals Value Taken Time  BP 117/77 06/10/21 1308  Temp    Pulse 73 06/10/21 1312  Resp 12 06/10/21 1312  SpO2 99 % 06/10/21 1312  Vitals shown include unvalidated device data.  Last Pain:  Vitals:   06/10/21 1029  TempSrc: Oral      Patients Stated Pain Goal: 5 (84/16/60 6301)  Complications: No notable events documented.

## 2021-06-10 NOTE — Op Note (Signed)
  OPERATIVE NOTE  Date: 06/10/21  Preoperative Diagnosis: left ovarian mass   Postoperative Diagnosis:  same  Procedure(s) Performed: Robotic-assisted laparoscopic left salpingo-oophorectomy  Surgeon: Everitt Amber, M.D.  Assistant Surgeon: [name], NP (an NP assistant was necessary for tissue manipulation, management of robotic instrumentation, retraction and positioning due to the complexity of the case and hospital policies).   Anesthesia: Gen. endotracheal.  Specimens: washings, left tube and ovary.   Estimated Blood Loss: 10 mL. Blood Replacement: None  Complications: none  Indication for Procedure:  left complex ovarian cyst, elevated CA 125  Operative Findings: evidence of endometriosis with hemosiderosis. Normal right tube and ovary, normal appearing uterine serosa. Left ovary mildly enlarged with endometrioma, adherent to the ovarian fossa.   Frozen pathology was consistent with benign endometrioma  Procedure: The patient's taken to the operating room and placed under general endotracheal anesthesia testing difficulty. She is placed in a dorsolithotomy position and cervical acromial pad was placed. The arms were tucked with care taken to pad the olecranon process. And prepped and draped in usual sterile fashion. A uterine manipulator (zumi) was placed vaginally. A 59mm incision was made in the left upper quadrant palmer's point and a 5 mm Optiview trocar used to enter the abdomen under direct visualization. With entry into the abdomen and then maintenance of 15 mm of mercury the patient was placed in Trendelenburg position. An incision was made in the umbilicus and a 94HW trochar was placed through this site. Two incisions were made lateral to the umbilical incision in the left and right abdomen measuring 68mm. These incisions were made approximately 10 cm lateral to the umbilical incision. 8 mm robotic trochars were inserted. The robot was docked.  The abdomen was inspected as  was the pelvis.  Pelvic washings were obtained. An incision was made on the left pelvic side wall peritoneum parallel to the IP ligament and the retroperitoneal space entered. The left ureter was identified and the para-rectal space was developed. A window was created in the left broad ligament above the ureter. The rleft infundibulopelvic vessels were skeletonized cauterized and transected. The utero-ovarian ligaments similarly were cauterized and transected. Specimen was placed in an Endo Catch bag.  The abdomen was copiously irrigated and drained and all operative sites inspected and hemostasis was assured.  The robot was undocked.  The contents of the left Endo Catch bag were first aspirated and then morcellated within the bag to facilitate removal from the abdominal cavity through the left upper quadrant incision. In a similar fashion the contents of the right Endo Catch bag or morcellated to facilitate removal from the abdominal cavity.  The ports were all remove. The fascial closure at the umbilical incision and left upper quadrant port was made with 0 Vicryl.  All incisions were closed with a running subcuticular Monocryl suture. Dermabond was applied. Sponge, lap and needle counts were correct x 3.    The patient had sequential compression devices for VTE prophylaxis.         Disposition: PACU -stable         Condition: stable  Donaciano Eva, MD

## 2021-06-10 NOTE — Anesthesia Procedure Notes (Signed)
Procedure Name: Intubation Date/Time: 06/10/2021 11:41 AM Performed by: Rogers Blocker, CRNA Pre-anesthesia Checklist: Patient identified, Emergency Drugs available, Suction available and Patient being monitored Patient Re-evaluated:Patient Re-evaluated prior to induction Oxygen Delivery Method: Circle System Utilized Preoxygenation: Pre-oxygenation with 100% oxygen Induction Type: IV induction Ventilation: Mask ventilation without difficulty Laryngoscope Size: Mac and 3 Grade View: Grade I Tube type: Oral Number of attempts: 1 Airway Equipment and Method: Stylet Placement Confirmation: ETT inserted through vocal cords under direct vision, positive ETCO2 and breath sounds checked- equal and bilateral Secured at: 22 cm Tube secured with: Tape Dental Injury: Teeth and Oropharynx as per pre-operative assessment  Comments: Soft bite block placed after intubation.

## 2021-06-10 NOTE — Progress Notes (Signed)
Called husband and left message that surgery went well, no cancer in the left ovary. Removed the left tube and ovary, no complications.  [name], MD

## 2021-06-11 ENCOUNTER — Encounter (HOSPITAL_BASED_OUTPATIENT_CLINIC_OR_DEPARTMENT_OTHER): Payer: Self-pay | Admitting: Gynecologic Oncology

## 2021-06-11 ENCOUNTER — Telehealth: Payer: Self-pay

## 2021-06-11 LAB — SURGICAL PATHOLOGY

## 2021-06-11 LAB — CYTOLOGY - NON PAP

## 2021-06-11 NOTE — Telephone Encounter (Signed)
Spoke with Wendy Grimes this morning. She states she is eating, drinking and urinating well. She has not had a BM yet but is passing gas. Instructed her to take the senokot-s 2 tabs bid until good BM.  If no BM by Saturday 06-13-21, she can add a capful of miralax bid.Encouraged her to drink plenty of water. She denies fever or chills. Belly button incision is draining some clear liquid and in another incision on her abdomen.  Told her apply pressure dressing and call the office if the drainage increases.t. Her pain is controlled with Tylenol and Ibuprofen.  Instructed to call office with any fever, chills, purulent drainage, uncontrolled pain or any other questions or concerns. Patient verbalizes understanding.  Pt aware of post op appointments as well as the office number [phone] and after hours number [phone] to call if she has any questions or concerns

## 2021-06-11 NOTE — Telephone Encounter (Signed)
Wendy Grimes states that she is experiencing some pulsating pain under her right rib.  It began ~1 hour ago.  She is using a heating pad.  Told her that this could be some gas pain.  Encouraged her to take a tramadol to help with the discomfort and walk around to help the gas dissipate. Denies SOB or Chest pain. She is to call pain increases.

## 2021-06-12 ENCOUNTER — Telehealth: Payer: Self-pay

## 2021-06-12 NOTE — Telephone Encounter (Signed)
Told Wendy Grimes that the pathology showed no cancer per Wendy Cross,Wendy Grimes. She states that she has slight swelling in her ankles bilaterally. Told her to keep legs elevated when sitting. No redness or swelling in calves.  She is able to bear weight with out pain. Call office if swelling increases.She is experiencing pain in her shoulders when she lies down.  Told her she can use heating pad to the area and apply product like icy hot to shoulders. Some discomfort maybe from positioning during surgery. She has had some nausea today and used  zofran with good effect. She is applying ice pack to abdomen. Pt  feels bloated. She did move her bowels today. . She has been up 4 times today and did a couple of laps around the house. Encouraged Wendy Grimes to increase ambulation. Up and walking around for 15 minutes every 60 to 90 minutes. Pt verbalized understanding.

## 2021-06-15 ENCOUNTER — Telehealth: Payer: Self-pay | Admitting: *Deleted

## 2021-06-15 NOTE — Telephone Encounter (Signed)
Pt with some swelling in her face and ankles. Told her that this can happen with the positioning of steep trendelenburg. The fluid will decrease gradually over time. She had a good BM yesterday. Not today.  Used a suppository yesterday and today. She is taking 2 senokot-s in the evening.  Instructed her to increase it to 2 tabs bid and add a capful of Miralax bid.  Told her if she has good bm with above that she may have some loose stool to follow. She can hold the laxative for a day and if no BM then add the miralax or senokot back, which ever one she feels helped her the most. Pt verbalized understanding.

## 2021-06-15 NOTE — Telephone Encounter (Signed)
Patient called and stated "I'm stilling having problems with retaining water. I have swelling in my ankles, feet, face and stomach. Also I'm having trouble with my bowels moving." Explained that the nurse will call her back .

## 2021-06-22 ENCOUNTER — Telehealth: Payer: Self-pay

## 2021-06-22 ENCOUNTER — Other Ambulatory Visit: Payer: Self-pay | Admitting: Gynecologic Oncology

## 2021-06-22 DIAGNOSIS — R112 Nausea with vomiting, unspecified: Secondary | ICD-10-CM

## 2021-06-22 DIAGNOSIS — R1032 Left lower quadrant pain: Secondary | ICD-10-CM

## 2021-06-22 NOTE — Progress Notes (Signed)
See RN telephone note. Patient calling with LLQ abdominal pain with burning sensation and nausea. CT ordered to further evaluate cause of pain. S/P robotic LSO on 06/10/21.

## 2021-06-22 NOTE — Telephone Encounter (Signed)
Received phone call from [place] with c/o persistent nausea and LLQ internal burning. Patient states she awoke this morning and within an hour felt nauseous. She tried having some toast and pepto bismol to settle her stomach but this did not help. For lunch she tried some oatmeal with banana, denies vomiting but still very nauseous. She states she feels full quickly. LLQ burning is constant. She said she has felt distended and bloated since her surgery. She reports normal BM for the past two days. Denies SOB. Incisions are clean, dry and intact. Pt. Reports axillary temp 97.2, she reports taking tylenol at noon.  [name], NP notified. Per Lenna Sciara will work on scheduling patient for CT. Instructed patient to call back with any worsening symptoms, questions or concerns. Patient verbalized understanding.

## 2021-06-22 NOTE — Telephone Encounter (Signed)
Followed up with Wendy Grimes. Her CT scan has been scheduled for 06/30/21 at 2:30pm. Instructed if symptoms improve prior to CT scan she can cancel the test. Patient agreeable to this. Patient states she also has slight numbness/ decrease in sensation in her left upper thigh with slight swelling. Denies warmth or redness at the sight. [name], NP notified. Instructed patient to seek immediate care if she develops chest pain or shortness of breath.  Patient verbalizes understanding and will call with questions or concerns. She states she has our after hours number.

## 2021-06-23 NOTE — Telephone Encounter (Signed)
Wendy Grimes states that she is feeling much better today. No n/v. LLQ internal burning improving. Told her that the slight numbness/decrease in sensation in her left upper thigh is from the surgery.  This will improve over time from surgery per Wendy Cross,NP. Scheduled the urine pregnancy test for 1130 on 06-30-21 at the [facility].  She will then go to admitting at 1200 to get water based contrast to drink from CT.  NPO after 1030 06-30-21. Arranged water based contrast with Wendy Grimes in [place].  Wendy Grimes will call the office Thursday with an update on her condition.  If her symptoms have completely resolved, the CT scan can be cancelled.

## 2021-06-26 ENCOUNTER — Telehealth: Payer: Self-pay

## 2021-06-26 NOTE — Telephone Encounter (Signed)
Received call from [place] regarding her symptoms. She states that they have improved and she is no longer having the burning on her left side and she is not feeling nauseous anymore. She states she would like to cancel her appointment for the CT scan. Patient has an appointment with Dr. Denman George on 7/27 and will follow up with her then. Appointment has been cancelled, instructed patient to call with any questions or concerns.

## 2021-06-30 ENCOUNTER — Ambulatory Visit (HOSPITAL_COMMUNITY): Admission: RE | Admit: 2021-06-30 | Payer: BC Managed Care – PPO | Source: Ambulatory Visit

## 2021-06-30 ENCOUNTER — Inpatient Hospital Stay: Payer: BC Managed Care – PPO

## 2021-07-01 ENCOUNTER — Encounter: Payer: Self-pay | Admitting: Gynecologic Oncology

## 2021-07-01 ENCOUNTER — Inpatient Hospital Stay: Payer: BC Managed Care – PPO | Attending: Gynecologic Oncology | Admitting: Gynecologic Oncology

## 2021-07-01 ENCOUNTER — Other Ambulatory Visit: Payer: Self-pay

## 2021-07-01 VITALS — BP 102/78 | HR 96 | Temp 97.4°F | Resp 16 | Ht 65.0 in | Wt 140.2 lb

## 2021-07-01 DIAGNOSIS — Z90721 Acquired absence of ovaries, unilateral: Secondary | ICD-10-CM | POA: Diagnosis not present

## 2021-07-01 DIAGNOSIS — D271 Benign neoplasm of left ovary: Secondary | ICD-10-CM | POA: Diagnosis not present

## 2021-07-01 DIAGNOSIS — N809 Endometriosis, unspecified: Secondary | ICD-10-CM | POA: Insufficient documentation

## 2021-07-01 DIAGNOSIS — N838 Other noninflammatory disorders of ovary, fallopian tube and broad ligament: Secondary | ICD-10-CM
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
## 2021-07-01 NOTE — Patient Instructions (Signed)
Dr Denman George recommends follow-up with Dr Delora Fuel in 12 months for an annual exam.

## 2021-07-02 ENCOUNTER — Ambulatory Visit: Payer: BC Managed Care – PPO | Admitting: Gynecologic Oncology

## 2021-07-07 ENCOUNTER — Ambulatory Visit: Payer: BC Managed Care – PPO | Admitting: Gynecologic Oncology

## 2021-07-22 ENCOUNTER — Telehealth: Payer: Self-pay

## 2021-07-22 NOTE — Telephone Encounter (Signed)
Returning call to [place]. Patient left message stating she had surgery with Dr. Denman George on 7/6. She started her period the day before her surgery and has just started again. The first 2 days were light but the past 2-3 days she's had very heavy bleeding. Changing high absorbency tampons hourly and getting up every 2-3 hours at night to change it. She's concerned with the amount of blood loss. Lightheadedness comes and goes. She is scheduled to have surgery 8/22 for a septoplasty and is wondering if this is still advisable.  Per [name], NP she needs to follow up with her gyn regarding the bleeding and reach out to her surgeon performing her septoplasty to see if he is ok with moving forward.  Patient verbalized understanding and will call with questions or concerns.

## 2021-07-23 DIAGNOSIS — N939 Abnormal uterine and vaginal bleeding, unspecified: Secondary | ICD-10-CM | POA: Diagnosis not present

## 2021-07-27 DIAGNOSIS — M950 Acquired deformity of nose: Secondary | ICD-10-CM | POA: Diagnosis not present

## 2021-07-27 DIAGNOSIS — J3489 Other specified disorders of nose and nasal sinuses: Secondary | ICD-10-CM | POA: Diagnosis not present

## 2021-07-27 DIAGNOSIS — J342 Deviated nasal septum: Secondary | ICD-10-CM | POA: Diagnosis not present

## 2021-07-27 DIAGNOSIS — J343 Hypertrophy of nasal turbinates: Secondary | ICD-10-CM | POA: Diagnosis not present

## 2021-10-02 DIAGNOSIS — E559 Vitamin D deficiency, unspecified: Secondary | ICD-10-CM | POA: Diagnosis not present

## 2021-10-02 DIAGNOSIS — R5383 Other fatigue: Secondary | ICD-10-CM | POA: Diagnosis not present

## 2021-10-02 DIAGNOSIS — E039 Hypothyroidism, unspecified: Secondary | ICD-10-CM | POA: Diagnosis not present

## 2021-10-02 DIAGNOSIS — R1012 Left upper quadrant pain: Secondary | ICD-10-CM | POA: Diagnosis not present

## 2021-10-02 DIAGNOSIS — D509 Iron deficiency anemia, unspecified: Secondary | ICD-10-CM | POA: Diagnosis not present

## 2021-10-02 DIAGNOSIS — N943 Premenstrual tension syndrome: Secondary | ICD-10-CM | POA: Diagnosis not present

## 2021-10-08 DIAGNOSIS — G35 Multiple sclerosis: Secondary | ICD-10-CM | POA: Diagnosis not present

## 2021-10-08 DIAGNOSIS — N959 Unspecified menopausal and perimenopausal disorder: Secondary | ICD-10-CM | POA: Diagnosis not present

## 2021-10-08 DIAGNOSIS — Z808 Family history of malignant neoplasm of other organs or systems: Secondary | ICD-10-CM | POA: Diagnosis not present

## 2021-10-08 DIAGNOSIS — N838 Other noninflammatory disorders of ovary, fallopian tube and broad ligament: Secondary | ICD-10-CM | POA: Diagnosis not present

## 2021-10-23 DIAGNOSIS — G35 Multiple sclerosis: Secondary | ICD-10-CM | POA: Diagnosis not present

## 2021-11-18 DIAGNOSIS — R350 Frequency of micturition: Secondary | ICD-10-CM | POA: Diagnosis not present

## 2021-11-24 DIAGNOSIS — J3489 Other specified disorders of nose and nasal sinuses: Secondary | ICD-10-CM | POA: Diagnosis not present

## 2021-11-24 DIAGNOSIS — M950 Acquired deformity of nose: Secondary | ICD-10-CM | POA: Diagnosis not present

## 2021-11-24 DIAGNOSIS — J342 Deviated nasal septum: Secondary | ICD-10-CM | POA: Diagnosis not present

## 2021-11-24 DIAGNOSIS — J343 Hypertrophy of nasal turbinates: Secondary | ICD-10-CM | POA: Diagnosis not present

## 2021-12-25 IMAGING — US US THYROID
1 series · 14 of 25 positions shown · non-contrast
Comparison: None.

CLINICAL DATA: Nontoxic multinodular goiter.

EXAM:
THYROID ULTRASOUND
TECHNIQUE: Ultrasound examination of the thyroid gland and adjacent soft
tissues was performed.

[Series 1: us thyroid · 0.05mm/px · 14 of 41 slices shown]
[im 1/41]
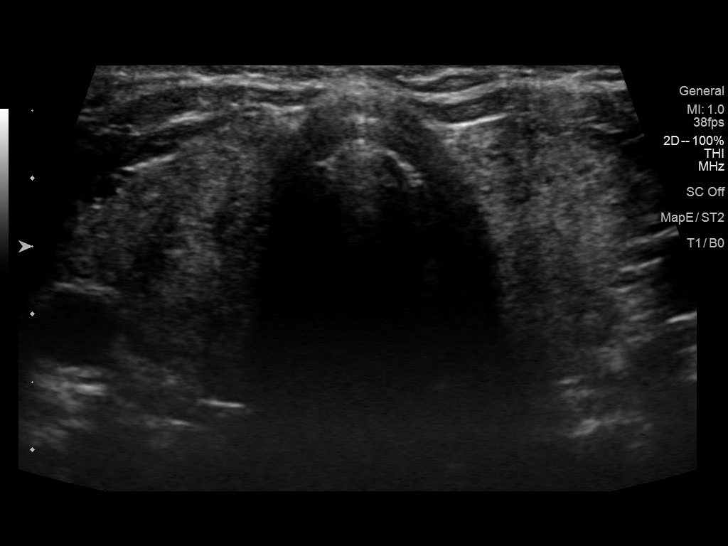
[im 4/41]
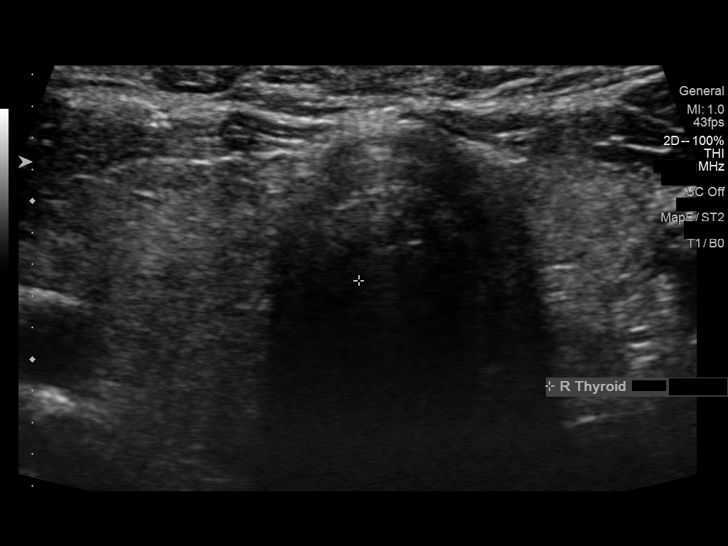
[im 7/41]
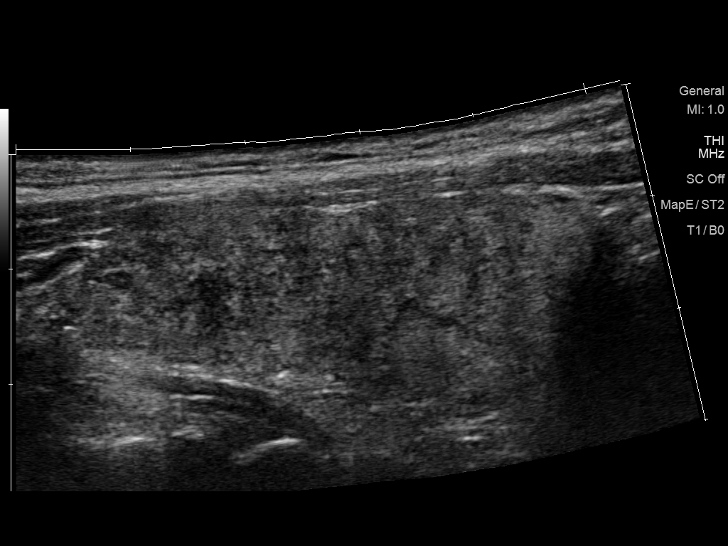
[im 11/41]
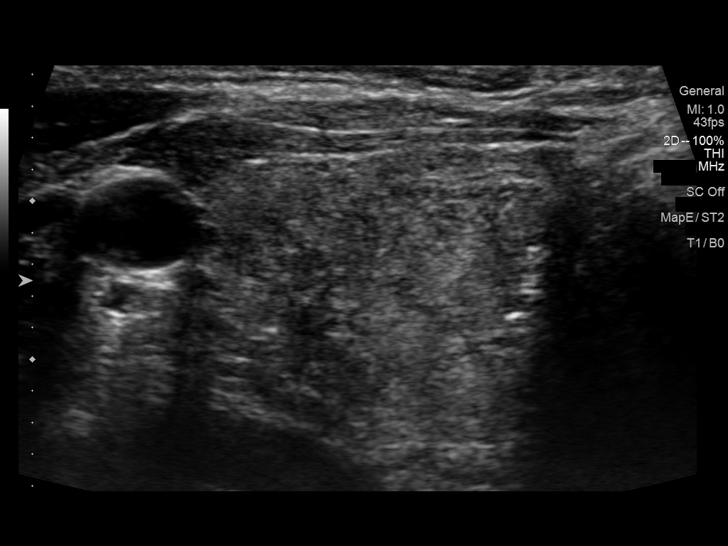
[im 14/41]
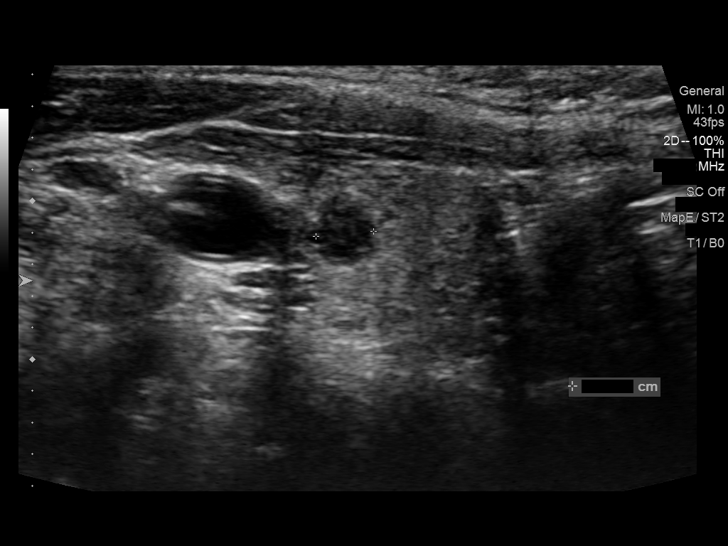
[im 16/41]
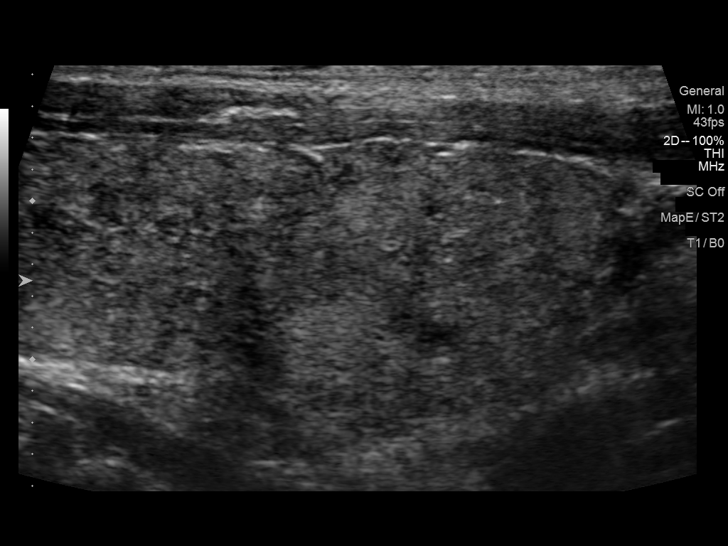
[im 19/41]
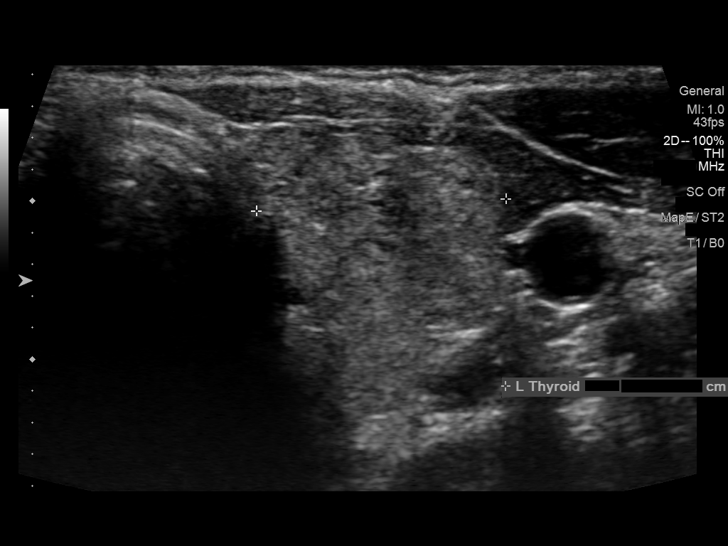
[im 22/41]
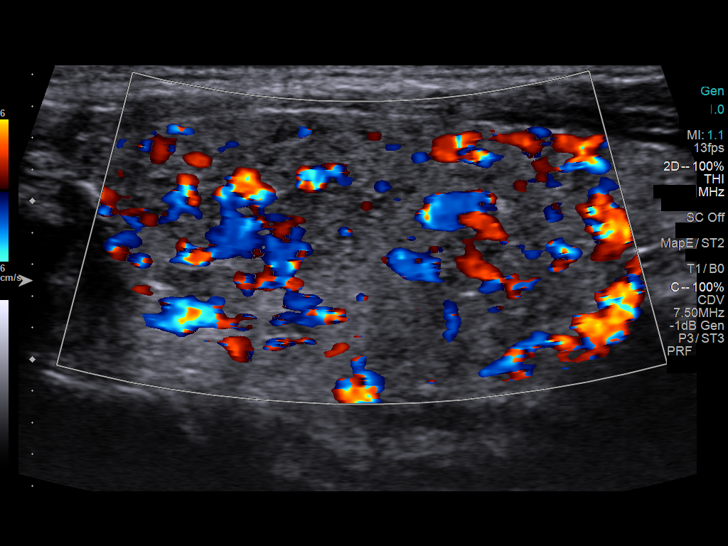
[im 26/41]
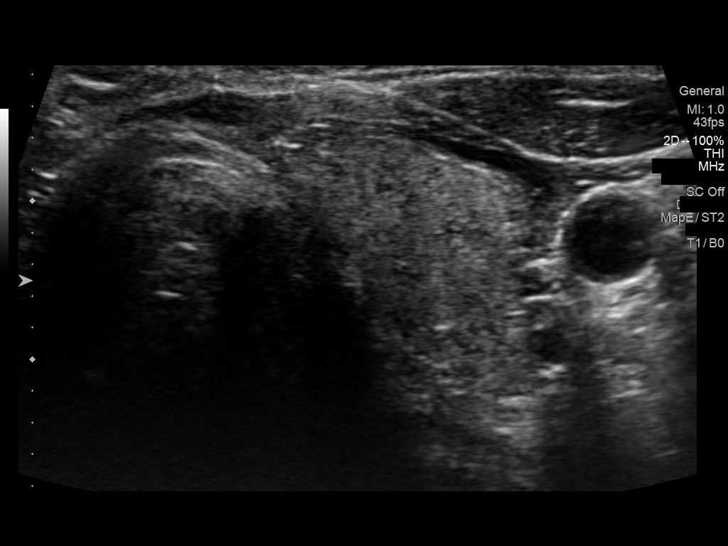
[im 27/41]
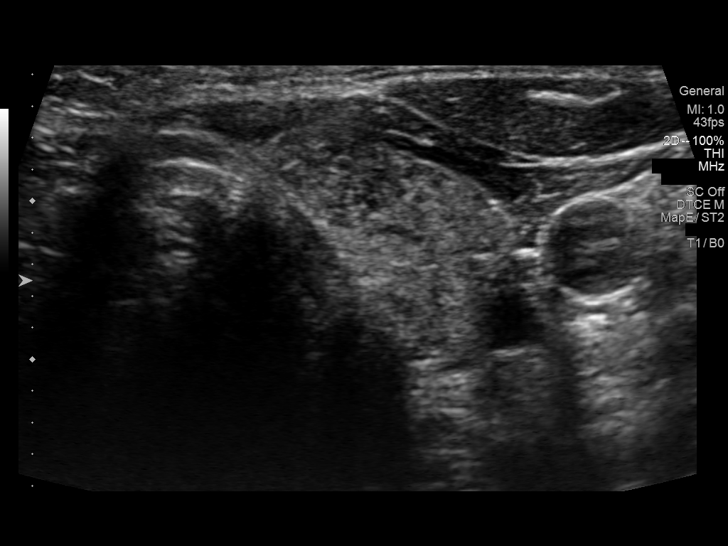
[im 31/41]
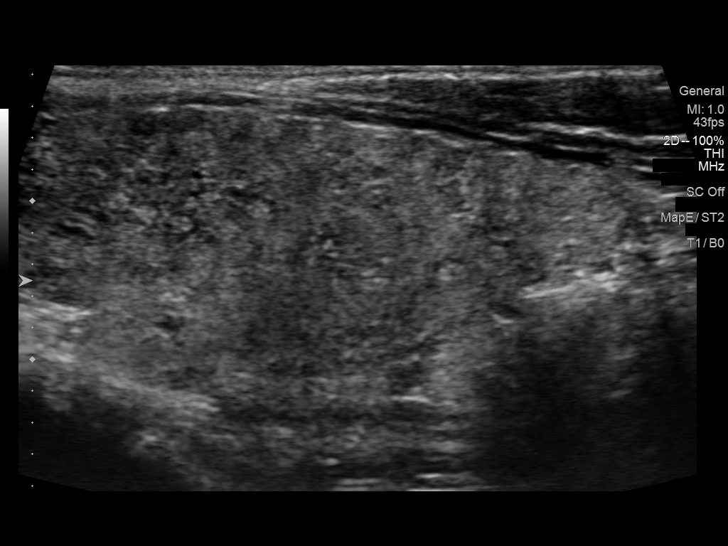
[im 34/41]
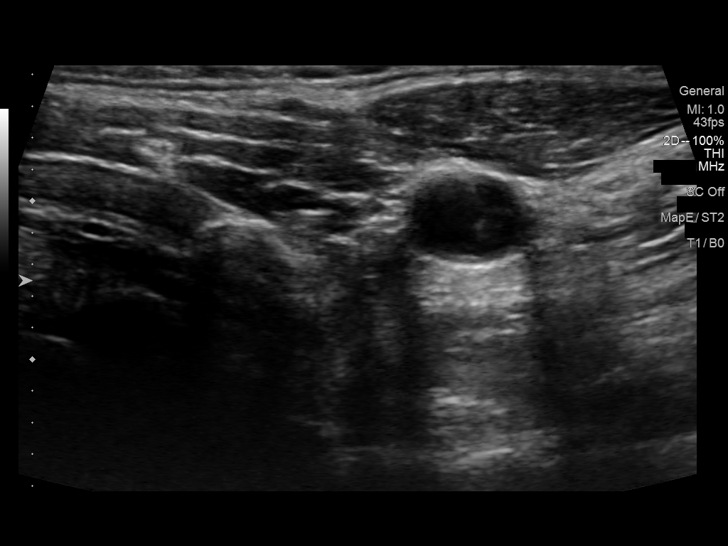
[im 37/41]
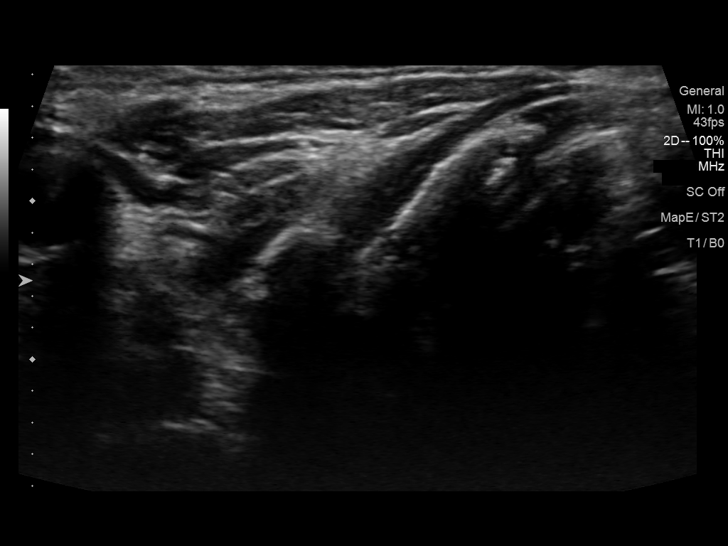
[im 41/41]
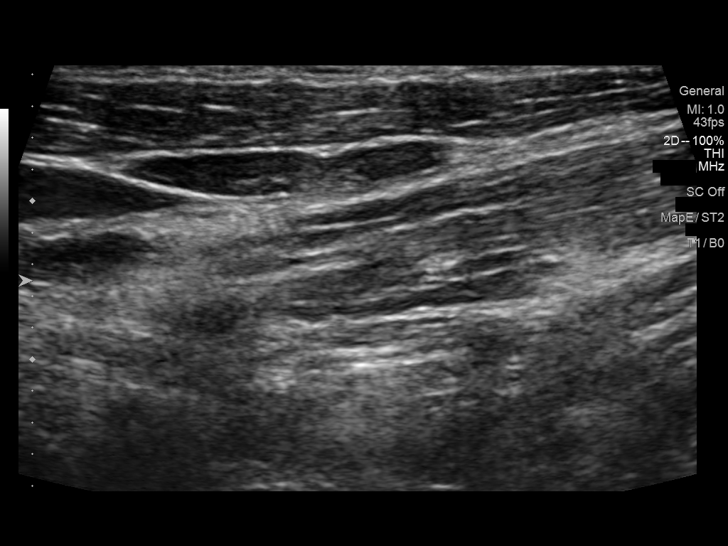

[14 of 25 positions shown; findings below may reference images not displayed]

FINDINGS: Parenchymal Echotexture: Markedly heterogenous

Isthmus: 0.4 cm

Right lobe: 4.8 x 1.8 x 1.8 cm

Left lobe: 4.4 x 2.0 x 1.6 cm

_________________________________________________________

Estimated total number of nodules >/= 1 cm: 0

Number of spongiform nodules >/=  2 cm not described below (TR1): 0

Number of mixed cystic and solid nodules >/= 1.5 cm not described
below (TR2): 0

_________________________________________________________

Hypoechoic nodule in the inferior right thyroid lobe measures
cm. Otherwise, no distinct thyroid nodules.

No enlarged lymph nodes around the thyroid.
IMPRESSION: Thyroid tissue is markedly heterogeneous. No suspicious thyroid
nodules.

The above is in keeping with the ACR TI-RADS recommendations - [HOSPITAL] 9651;[DATE].

## 2022-10-14 ENCOUNTER — Other Ambulatory Visit: Payer: Self-pay | Admitting: Obstetrics and Gynecology

## 2022-10-14 ENCOUNTER — Encounter: Payer: Self-pay | Admitting: Obstetrics and Gynecology

## 2022-10-14 DIAGNOSIS — Z1231 Encounter for screening mammogram for malignant neoplasm of breast: Secondary | ICD-10-CM

## 2022-11-16 ENCOUNTER — Other Ambulatory Visit: Payer: Self-pay | Admitting: Obstetrics and Gynecology

## 2022-11-16 DIAGNOSIS — N644 Mastodynia: Secondary | ICD-10-CM

## 2022-12-15 ENCOUNTER — Ambulatory Visit: Payer: BC Managed Care – PPO

## 2023-04-12 IMAGING — CT CT ABD-PELV W/ CM
2 of 5 series · 16 of 46 positions shown, 18 images · IV contrast (APPLIED)
Comparison: None.

CLINICAL DATA: Left ovarian mass, abdominal staging

EXAM:
CT ABDOMEN AND PELVIS WITH CONTRAST
TECHNIQUE: Multidetector CT imaging of the abdomen and pelvis was performed
using the standard protocol following bolus administration of
intravenous contrast.
CONTRAST:  100mL OMNIPAQUE IOHEXOL 300 MG/ML SOLN, additional oral
enteric contrast

[Series 2: axial st · axial · 0.73mm/px · z∈[-850,-455]mm · 13 of 93 slices shown, 15 images]
[im 7/93  soft-tissue]
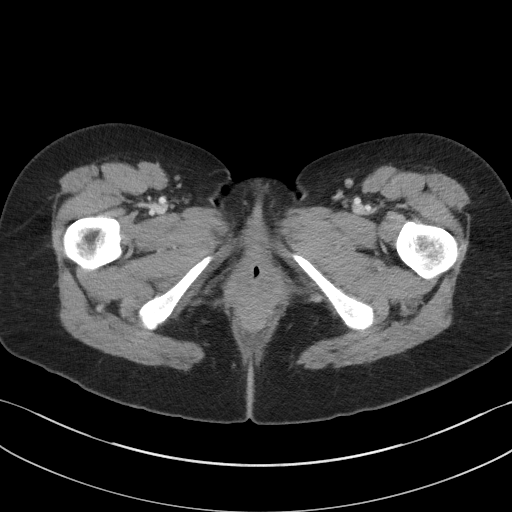
[im 7/93  bone]
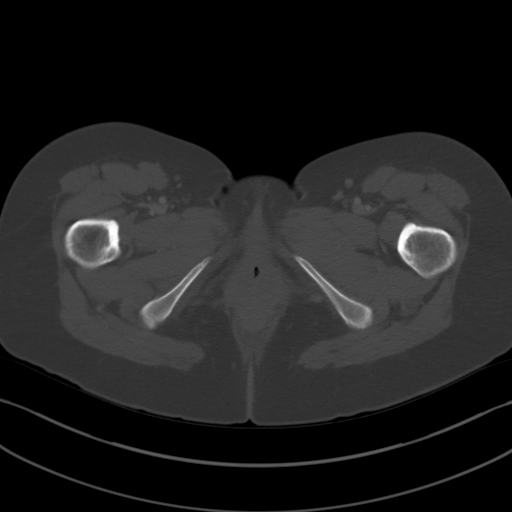
[im 13/93  soft-tissue]
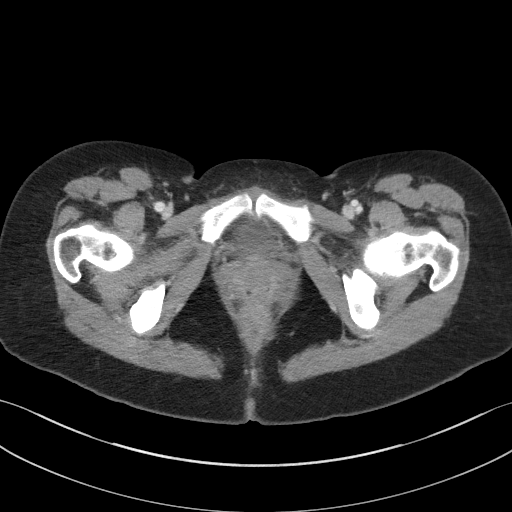
[im 19/93  soft-tissue]
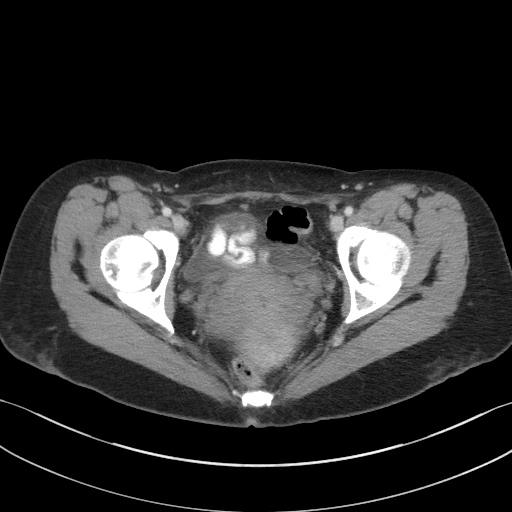
[im 25/93  soft-tissue]
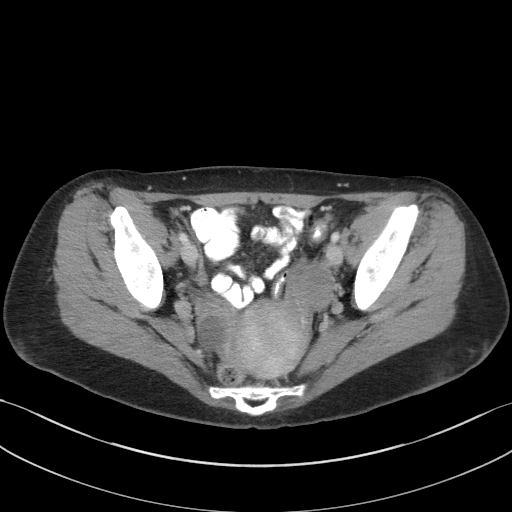
[im 31/93  soft-tissue]
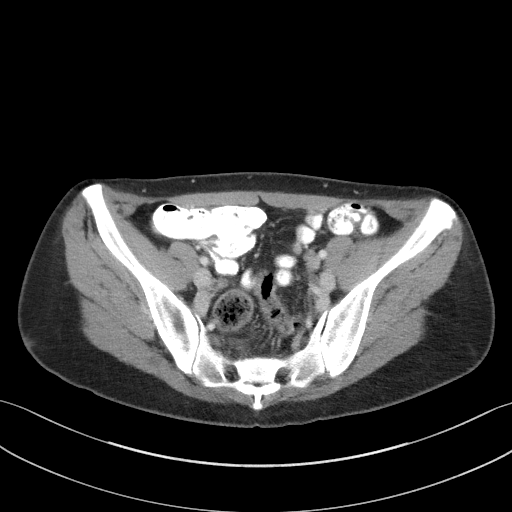
[im 37/93  soft-tissue]
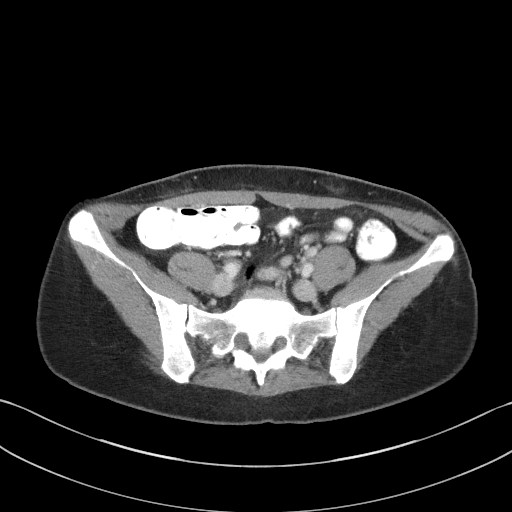
[im 50/93  soft-tissue]
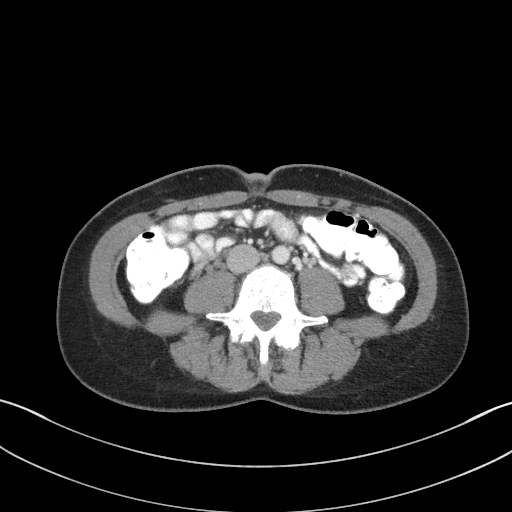
[im 56/93  soft-tissue]
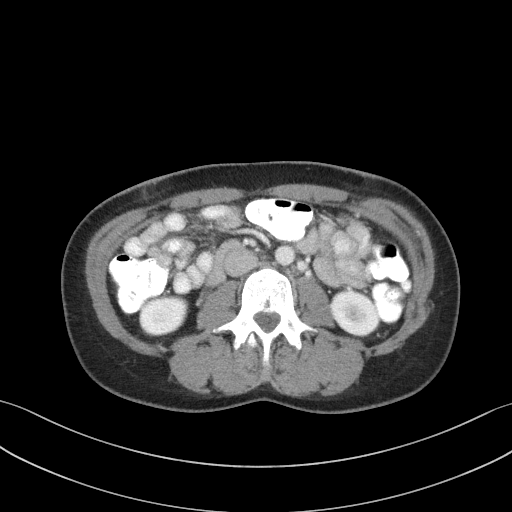
[im 62/93  soft-tissue]
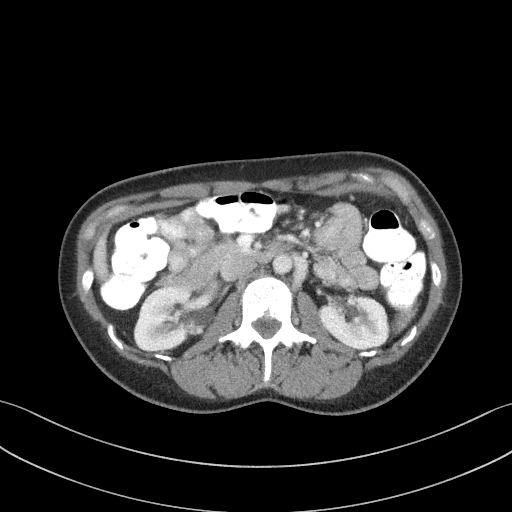
[im 62/93  bone]
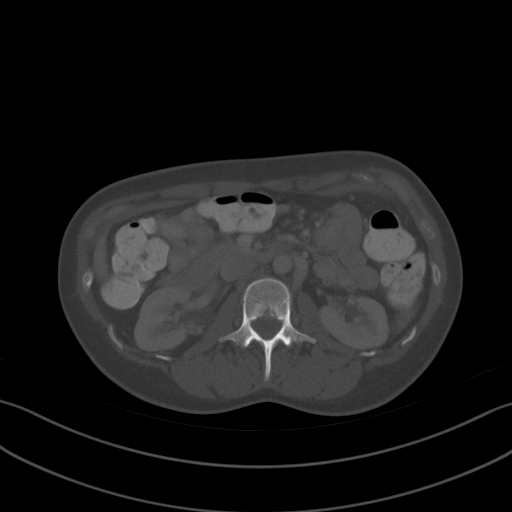
[im 68/93  soft-tissue]
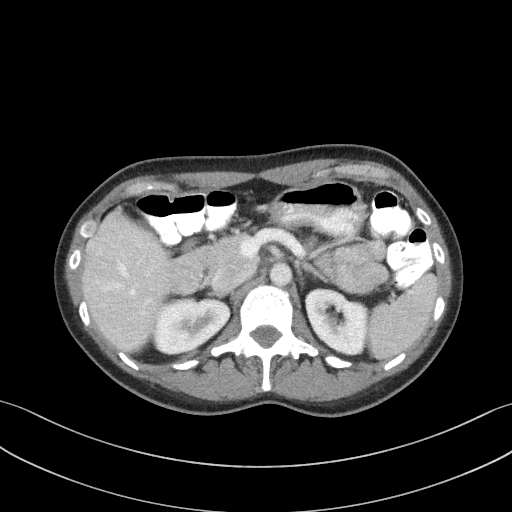
[im 74/93  soft-tissue]
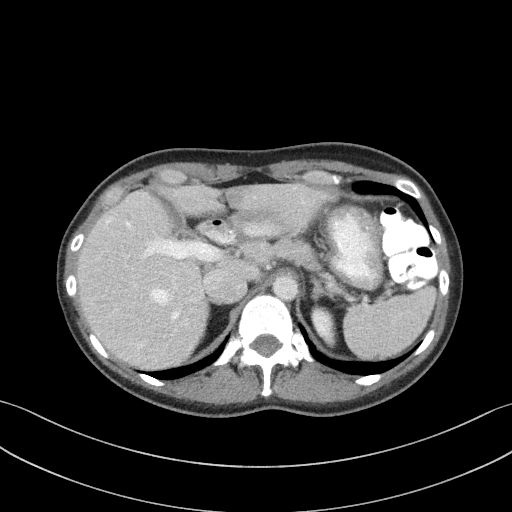
[im 80/93  soft-tissue]
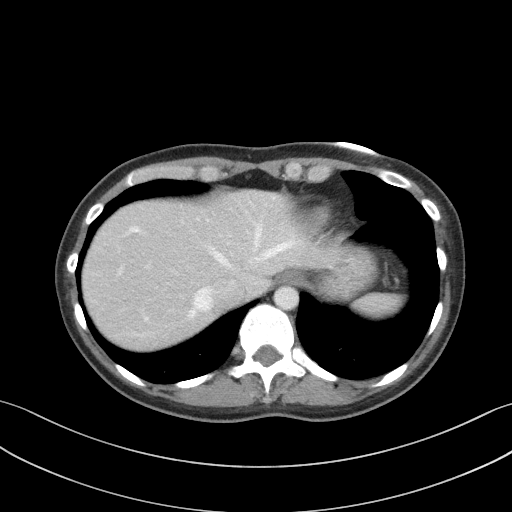
[im 86/93  soft-tissue]
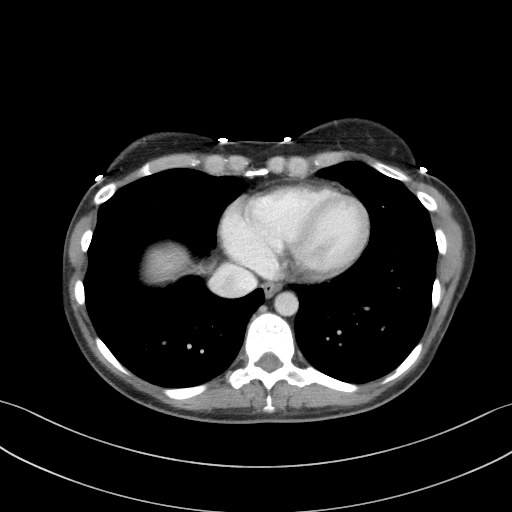

[Series 5: coronal st · coronal · 0.72mm/px · 3 of 77 slices shown]
[im 26/77  soft-tissue]
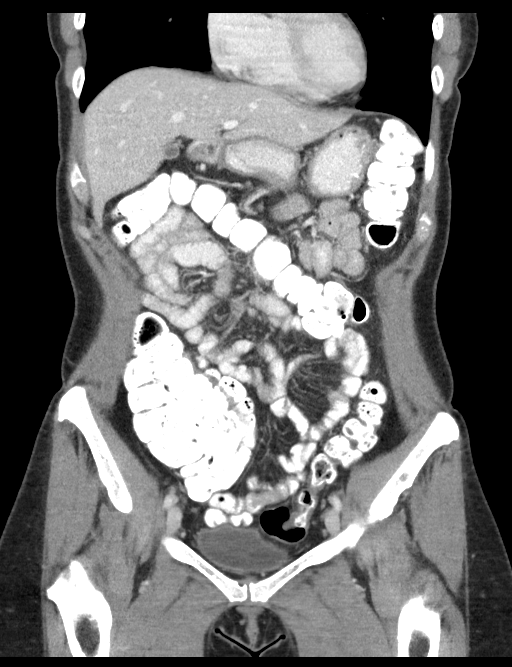
[im 34/77  soft-tissue]
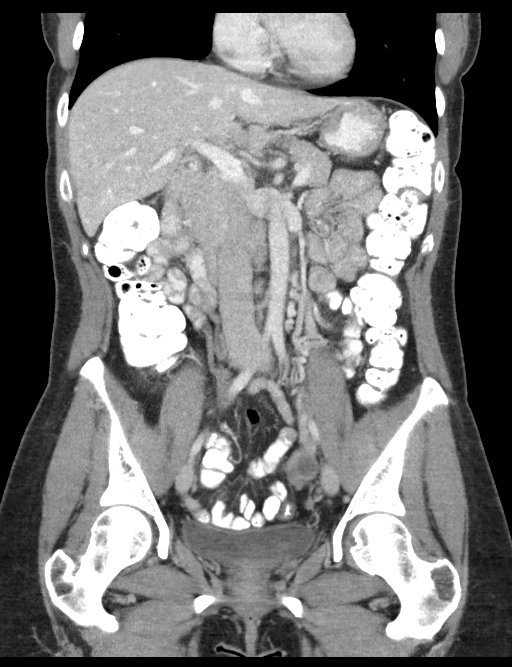
[im 43/77  soft-tissue]
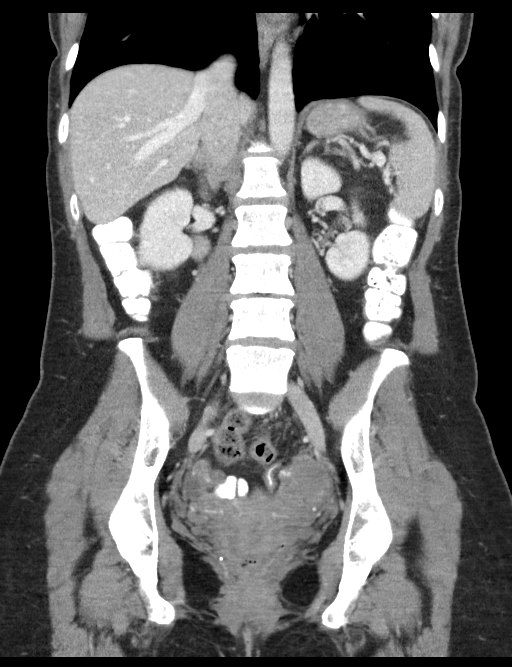

[16 of 46 positions shown; findings below may reference images not displayed]

FINDINGS: Lower chest: No acute abnormality.

Hepatobiliary: No solid liver abnormality is seen. No gallstones,
gallbladder wall thickening, or biliary dilatation.

Pancreas: Unremarkable. No pancreatic ductal dilatation or
surrounding inflammatory changes.

Spleen: Normal in size without significant abnormality.

Adrenals/Urinary Tract: Adrenal glands are unremarkable. Kidneys are
normal, without renal calculi, solid lesion, or hydronephrosis.
Bladder is unremarkable.

Stomach/Bowel: Stomach is within normal limits. Appendix appears
normal. No evidence of bowel wall thickening, distention, or
inflammatory changes.

Vascular/Lymphatic: No significant vascular findings are present. No
enlarged abdominal or pelvic lymph nodes.

Reproductive: The left ovary is slightly enlarged by a mixed solid
and cystic mass measuring 4.1 x 3.9 x 3.7 cm (series 2, image 68,
series 5, image 38). There is a simple appearing cyst of the right
ovary measuring 3.0 cm (series 2, image 72).

Other: No abdominal wall hernia or abnormality. No abdominopelvic
ascites.

Musculoskeletal: No acute or significant osseous findings.
IMPRESSION: 1. The left ovary is slightly enlarged by a mixed solid and cystic
mass measuring 4.1 x 3.9 x 3.7 cm. Findings are in keeping with mass
reportedly identified at prior ultrasound.
2. No evidence of lymphadenopathy or metastatic disease in the
abdomen or pelvis.
